# Patient Record
Sex: Male | Born: 1970 | Race: White | Hispanic: Yes | Marital: Married | Smoking: Never smoker
Health system: Southern US, Community
[De-identification: ages and names within clinical notes are randomized; demographics above are authoritative.]

## PROBLEM LIST (undated history)

## (undated) DIAGNOSIS — IMO0001 Reserved for inherently not codable concepts without codable children: Secondary | ICD-10-CM

## (undated) DIAGNOSIS — R030 Elevated blood-pressure reading, without diagnosis of hypertension: Secondary | ICD-10-CM

## (undated) HISTORY — DX: Elevated blood-pressure reading, without diagnosis of hypertension: R03.0

## (undated) HISTORY — DX: Reserved for inherently not codable concepts without codable children: IMO0001

---

## 2011-06-06 ENCOUNTER — Encounter: Payer: Self-pay | Admitting: Family Medicine

## 2011-06-06 ENCOUNTER — Ambulatory Visit (INDEPENDENT_AMBULATORY_CARE_PROVIDER_SITE_OTHER): Payer: Managed Care, Other (non HMO) | Admitting: Family Medicine

## 2011-06-06 VITALS — BP 138/98 | HR 72 | Temp 98.3°F | Ht 68.5 in | Wt 170.4 lb

## 2011-06-06 DIAGNOSIS — R109 Unspecified abdominal pain: Secondary | ICD-10-CM

## 2011-06-06 DIAGNOSIS — R1013 Epigastric pain: Secondary | ICD-10-CM | POA: Insufficient documentation

## 2011-06-06 DIAGNOSIS — IMO0001 Reserved for inherently not codable concepts without codable children: Secondary | ICD-10-CM | POA: Insufficient documentation

## 2011-06-06 DIAGNOSIS — R030 Elevated blood-pressure reading, without diagnosis of hypertension: Secondary | ICD-10-CM

## 2011-06-06 MED ORDER — OMEPRAZOLE 40 MG PO CPDR: 40.0000 mg | DELAYED_RELEASE_CAPSULE | Freq: Every day | ORAL | Status: DC

## 2011-06-06 NOTE — Assessment & Plan Note (Signed)
Has been told elevated in past.  Today diastolic elevated, first time here.  Re eval next visit. Poor diet, discussed more fruits/vegetables.

## 2011-06-06 NOTE — Assessment & Plan Note (Signed)
Anticipate GERD vs gastritis. Treat for both with 1 mo course of PPI - provided with prilosec 20mg  samples, then sent in 40mg  dose to pharmacy. Check baseline labs as well as H pylori. Return in 1 mo for f/u, sooner if worsening or not improving as expected. No red flags today

## 2011-06-06 NOTE — Progress Notes (Signed)
Subjective:    Patient ID: Christian Weiss, male    DOB: 11-03-1970, 40 y.o.   MRN: [id number]  HPI CC: new pt establish  Spanish speaker.  Stomach issues - since 09/2010 having stomach issues.  abd swelling.  Went to [facility], given med and improved but then came back.  Doesn't know what he was treated with or what diagnosis was.  Has been having epigastric abd pain for months now as well as abd distension.  No h/o acid reflux.  No throat clearing.  + gassy/bloated.  + diarrhea, black stools when takes peptobismol.  + nausea intermittently.  sxs worse with spicy foods, tomatoes.  No fevers/chills, vomiting, constipation, weight loss.  Has actually gained weight over last few months.    Poor diet.  Works at night.  Lives with daughters.  Wife left him.  Takes advil rarely.  Medications and allergies reviewed and updated in chart.  Past histories reviewed and updated if relevant as below. There is no problem list on file for this patient.  Past Medical History  Diagnosis Date  . Elevated BP    No past surgical history on file. History  Substance Use Topics  . Smoking status: Never Smoker   . Smokeless tobacco: Never Used  . Alcohol Use: No   Family History  Problem Relation Age of Onset  . Cancer Father 68    brain  . Diabetes Neg Hx   . Hypertension Neg Hx   . Coronary artery disease Neg Hx   . Stroke Neg Hx    No Known Allergies No current outpatient prescriptions on file prior to visit.   Review of Systems  Constitutional: Negative for fever, chills, activity change, appetite change, fatigue and unexpected weight change.  HENT: Negative for hearing loss and neck pain.   Eyes: Negative for visual disturbance.  Respiratory: Negative for cough, chest tightness, shortness of breath and wheezing.   Cardiovascular: Negative for chest pain, palpitations and leg swelling.  Gastrointestinal: Positive for nausea, diarrhea, blood in stool (when wiping) and abdominal distention.  Negative for vomiting, abdominal pain and constipation.  Genitourinary: Negative for hematuria and difficulty urinating.  Musculoskeletal: Negative for myalgias and arthralgias.  Skin: Negative for rash.  Neurological: Positive for headaches. Negative for dizziness, seizures and syncope.  Hematological: Does not bruise/bleed easily.  Psychiatric/Behavioral: Negative for dysphoric mood. The patient is not nervous/anxious.       Objective:   Physical Exam  Nursing note and vitals reviewed. Constitutional: He is oriented to person, place, and time. He appears well-developed and well-nourished. No distress.  HENT:  Head: Normocephalic and atraumatic.  Right Ear: Hearing, tympanic membrane, external ear and ear canal normal.  Left Ear: Hearing, tympanic membrane, external ear and ear canal normal.  Nose: Nose normal. No mucosal edema or rhinorrhea.  Mouth/Throat: Uvula is midline, oropharynx is clear and moist and mucous membranes are normal. No oropharyngeal exudate, posterior oropharyngeal edema, posterior oropharyngeal erythema or tonsillar abscesses.  Eyes: Conjunctivae and EOM are normal. Pupils are equal, round, and reactive to light. No scleral icterus.  Neck: Normal range of motion. Neck supple. No thyromegaly present.  Cardiovascular: Normal rate, regular rhythm, normal heart sounds and intact distal pulses.   No murmur heard. Pulses:      Radial pulses are 2+ on the right side, and 2+ on the left side.  Pulmonary/Chest: Effort normal and breath sounds normal. No respiratory distress. He has no wheezes. He has no rales.  Abdominal: Soft. Bowel sounds  are normal. He exhibits no distension, no abdominal bruit and no mass. There is no hepatosplenomegaly. There is tenderness (mild) in the epigastric area. There is no rebound, no guarding and no CVA tenderness. Hernia confirmed negative in the ventral area.  Musculoskeletal: Normal range of motion.  Lymphadenopathy:    He has no cervical  adenopathy.  Neurological: He is alert and oriented to person, place, and time.       CN grossly intact, station and gait intact  Skin: Skin is warm and dry. No rash noted.  Psychiatric: He has a normal mood and affect. His behavior is normal. Judgment and thought content normal.      Assessment & Plan:

## 2011-06-06 NOTE — Patient Instructions (Signed)
suena como si tiene gastritis - tome muestra de medicina prilosec 20mg  2 veces diarias (recetada 40 mg entonces una vez).  Tome medicina por 4 semanas y regrese. sangre hoy. regrese en 1 mes. Gusto conocerlo hoy.  llamenos con preguntas.  Para gastritis:  Head of bed elevated. Avoidance of citrus, fatty foods, chocolate, peppermint, and excessive alcohol, along with sodas, orange juice (acidic drinks) At least a few hours between dinner and bed, minimize naps after eating. No smoking.

## 2011-06-07 LAB — COMPREHENSIVE METABOLIC PANEL
ALT: 28 U/L (ref 0–53)
Alkaline Phosphatase: 72 U/L (ref 39–117)
Creatinine, Ser: 0.6 mg/dL (ref 0.4–1.5)
GFR: 159.98 mL/min (ref >60.00)
Sodium: 141 mEq/L (ref 135–145)
Total Bilirubin: 0.8 mg/dL (ref 0.3–1.2)
Total Protein: 6.9 g/dL (ref 6.0–8.3)

## 2011-06-07 LAB — CBC WITH DIFFERENTIAL/PLATELET
Basophils Absolute: 0 10*3/uL (ref 0.0–0.1)
HCT: 41.9 % (ref 39.0–52.0)
Hemoglobin: 14.4 g/dL (ref 13.0–17.0)
Lymphs Abs: 1.6 10*3/uL (ref 0.7–4.0)
MCHC: 34.3 g/dL (ref 30.0–36.0)
MCV: 87.7 fl (ref 78.0–100.0)
Monocytes Absolute: 0.5 10*3/uL (ref 0.1–1.0)
Neutro Abs: 2.5 10*3/uL (ref 1.4–7.7)
Platelets: 180 10*3/uL (ref 150.0–400.0)
RDW: 12.4 % (ref 11.5–14.6)

## 2011-06-07 LAB — TSH: TSH: 1.95 u[IU]/mL (ref 0.35–5.50)

## 2011-06-08 LAB — H. PYLORI ANTIBODY, IGG: H Pylori IgG: NEGATIVE

## 2011-07-06 ENCOUNTER — Ambulatory Visit (INDEPENDENT_AMBULATORY_CARE_PROVIDER_SITE_OTHER): Payer: Managed Care, Other (non HMO) | Admitting: Family Medicine

## 2011-07-06 ENCOUNTER — Encounter: Payer: Self-pay | Admitting: Family Medicine

## 2011-07-06 VITALS — BP 146/92 | HR 72 | Temp 98.2°F | Wt 169.8 lb

## 2011-07-06 DIAGNOSIS — Z23 Encounter for immunization: Secondary | ICD-10-CM

## 2011-07-06 DIAGNOSIS — R030 Elevated blood-pressure reading, without diagnosis of hypertension: Secondary | ICD-10-CM

## 2011-07-06 DIAGNOSIS — R1013 Epigastric pain: Secondary | ICD-10-CM

## 2011-07-06 DIAGNOSIS — K3189 Other diseases of stomach and duodenum: Secondary | ICD-10-CM

## 2011-07-06 MED ORDER — OMEPRAZOLE 40 MG PO CPDR: 40.0000 mg | DELAYED_RELEASE_CAPSULE | Freq: Every day | ORAL | Status: DC

## 2011-07-06 NOTE — Assessment & Plan Note (Signed)
Significant improvement on PPI.  Anticipate component of gastritis from spicy foods. Continue PRN use of omeprazole, sent in to pharmacy. RTC 3 mo for f/u. Return sooner if needed.

## 2011-07-06 NOTE — Patient Instructions (Signed)
Flu shot today. creo que tiene un poco de gastritis - use omeprazole 40mg  diarios cuando necesite. Trate de bajar comida picante. No debe tomar ibuprofeno, advil, motrina.  Tylenol (acetaminophen) esta bien. Para presion - mas frutas y verduras, menos sal/sodio. regrese en 3 meses.  Gastritis (Gastritis) La gastritis es una inflamacin (reaccin del organismo a una lesin o infeccin) del Teaching laboratory technician. A menudo la causan infecciones virales o bacterianas (grmenes). Otras causas pueden ser ciertas sustancias qumicas (incluyendo el alcohol) y los medicamentos. Esta enfermedad puede estar asociada a un malestar generalizado (sentirse cansado y mal), calambres y [place]. La enfermedad puede durar [employer] y [place]. Si los sntomas de gastritis continan, podr ser necesario someterse a una gastroscopa (observacin del estmago con un instrumento similar a un telescopio), a Physiological scientist biopsia (extraccin de Lauris Poag de tejido), y/o a pruebas de [place]. Los antibiticos no sern de utilidad a menos que la causa sea una infeccin bacteriana. Cuando la causa es [place], puede deberse a un organismo conocido como H. pylori. En este caso puede tratarse con antibiticos. Otras formas de gastritis se deben a una produccin excesiva de cido en el estmago. Esto puede tratarse con [employer] bloqueadores H2 y los anticidos. Generalmente todo lo que se necesita es un Medical laboratory scientific officer. Los nios pequeos podrn deshidratarse (perder los lquidos del organismo) con facilidad si los vmitos continan junto con la diarrea. Pueden administrarse algunos medicamentos que controlarn las nuseas. En general no se prescriben medicamentos para la diarrea, a menos que sea particularmente molesta. Algunos medicamentos retrasan la eliminacin de los virus del tracto gastrointestinal. Este factor hace ms lento el proceso de curacin. INSTRUCCIONES PARA EL CUIDADO DOMICILIARIO Instrucciones para el cuidado  domiciliario para las nuseas y los vmitos:  Para los adultos: beba con frecuencia cantidades pequeas de lquido. Beba al menos 2 litros por da. Beba a sorbos con frecuencia. No beba grandes cantidades de lquido de [employer]. As podr [employer] nuseas.   Utilice los medicamentos de venta libre o de prescripcin para Chief Technology Officer, Environmental health practitioner o la [place], segn se lo indique el profesional que lo asiste.   Beba slo lquidos claros. Son los lquidos transparentes como el agua, [address], u bebidas ligeras.   Una vez que usted [employer] lquidos claros, puede tomar cualquier lquido, sopas, jugos y helados o sorbetes. Agregue lentamente alimentos blandos (sin condimentar) a la dieta.  Instrucciones para el cuidado domiciliario en los [place] de diarrea:  La diarrea puede estar causada por una infeccin bacteriana o un virus. Su problema debe mejorar con el transcurso del [place], reposo, lquidos y/o los medicamentos antidiarreicos.   Hasta que la diarrea est bajo control, usted deber beber lquidos claros en pequeas cantidades, con frecuencia. Los lquidos claros incluyen: agua, caldo, gelatina, y t liviano.  Evite:  Leche.   Nils Pyle.   Tabaco.   Alcohol.   Lquidos muy calientes o fros.   Comer demasiado a Licensed conveyancer.  Cuando la diarrea se Chief Strategy Officer, puede agregar los siguientes alimentos que ayudan a formar las heces:  Surveyor, minerals.   Pltanos.   Manzanas sin cscara.   Tostadas secas.  Una vez que estos alimentos son [place], puede agregar yogur con bajo contenido de grasa y requesn con bajo contenido de [place]. Estos alimentos harn que recupere el equilibrio de las bacterias intestinales. Lave bien sus manos para evitar que la bacteria o el virus se diseminen. SOLICITE ATENCIN MDICA DE INMEDIATO SI:  No puede retener lquidos.  Los vmitos o la diarrea se Therapist, music (se repiten una y Laverda Page).   Aparece dolor abdominal, o ste aumenta o se localiza. Un dolor ubicado  hacia el lado derecho del abdomen puede deberse a apendicitis. Un dolor en un adulto ubicado en el lado izquierdo del abdomen puede sugerir una diverticulitis.   Aparece fiebre alta, entendida como una temperatura de ms de 102 F (38.9 C), o la temperatura que le haya indicado el profesional que lo asiste, durante ms de [address].   La diarrea se hace excesiva o contiene sangre o mucosidad.   Presenta debilidad excesiva, mareos, lipotimia o sed extrema.  Document Released: 04/20/2005 Document Revised: 03/23/2011 [facility] Patient Information 2012 [place], [place].

## 2011-07-06 NOTE — Progress Notes (Signed)
  Subjective:    Patient ID: Christian Weiss, male    DOB: 1971-03-06, 40 y.o.   MRN: [id number]  HPI CC: 1 mo f/u  Seen last month with dx dyspepsia, H pylori and other blood work negative.  Treated with 1 mo course of omeprazole.  significantly improved sxs on omeprazole.  Notes sometimes awakening in middle of sleep with hunger.  Third shift worker, sleeps during day.  Omeprazole caused some constipation.  Feels some indigestion, gassy continued.  Also noted elevated BP, here for recheck of that today.  Regular salt diet.  Does drink water.  No fruit, poor vegetable intake.   Wt Readings from Last 3 Encounters:  07/06/11 169 lb 12 oz (76.998 kg)  06/06/11 170 lb 6.4 oz (77.293 kg)   Flu shot - would like today. Tetanus - would like to defer.  Review of Systems Per HPI    Objective:   Physical Exam  Nursing note and vitals reviewed. Constitutional: He appears well-developed and well-nourished. No distress.  Abdominal: Soft. He exhibits no distension and no mass. Bowel sounds are increased. There is no hepatosplenomegaly. There is tenderness (mild) in the epigastric area. There is no rebound and no guarding.  Skin: Skin is warm and dry. No rash noted. No erythema.  Psychiatric: He has a normal mood and affect.       Assessment & Plan:

## 2011-07-06 NOTE — Assessment & Plan Note (Signed)
Remaining elevated. Poor diet. rec low salt, high potassium diet. rtc 3 mo for recheck, if staying elevated, start meds. Last visit blood work normal. May need EKG if med started.

## 2012-02-01 ENCOUNTER — Other Ambulatory Visit: Payer: Self-pay | Admitting: Internal Medicine

## 2012-02-01 LAB — TROPONIN I: Troponin-I: 0.03 ng/mL

## 2013-10-16 ENCOUNTER — Ambulatory Visit (INDEPENDENT_AMBULATORY_CARE_PROVIDER_SITE_OTHER): Payer: Managed Care, Other (non HMO) | Admitting: Family Medicine

## 2013-10-16 ENCOUNTER — Encounter: Payer: Self-pay | Admitting: Family Medicine

## 2013-10-16 VITALS — BP 142/94 | HR 66 | Temp 98.1°F | Wt 170.5 lb

## 2013-10-16 DIAGNOSIS — H9209 Otalgia, unspecified ear: Secondary | ICD-10-CM | POA: Insufficient documentation

## 2013-10-16 DIAGNOSIS — R300 Dysuria: Secondary | ICD-10-CM

## 2013-10-16 LAB — POCT URINALYSIS DIPSTICK
Blood, UA: NEGATIVE
Glucose, UA: NEGATIVE
KETONES UA: NEGATIVE
LEUKOCYTES UA: NEGATIVE
Nitrite, UA: NEGATIVE
PH UA: 6.5
Urobilinogen, UA: NEGATIVE

## 2013-10-16 MED ORDER — HYDROCORTISONE-ACETIC ACID 1-2 % OT SOLN: 5.0000 [drp] | Freq: Two times a day (BID) | OTIC | Status: DC

## 2013-10-16 MED ORDER — CIPROFLOXACIN HCL 500 MG PO TABS: 500.0000 mg | ORAL_TABLET | Freq: Two times a day (BID) | ORAL | Status: DC

## 2013-10-16 NOTE — Assessment & Plan Note (Signed)
Possible epididymitis - treat with cipro course. UA WNL today. UCx and ct/gc probes sent today. rec condoms during treatment. Update if not improving as expected.

## 2013-10-16 NOTE — Assessment & Plan Note (Signed)
Ear exam benign except for mild erythema of posterior canal.  Pt does wear headphones at work - I wonder if poor fit leading to ear pain.  Will treat possible mild otitis externa with vosol HC drops and pt will update me if sxs persist or fail to improve.  TM clear without significant fluid today.

## 2013-10-16 NOTE — Progress Notes (Signed)
Pre visit review using our clinic review tool, if applicable. No additional management support is needed unless otherwise documented below in the visit note. 

## 2013-10-16 NOTE — Progress Notes (Signed)
BP 142/94  Pulse 66  Temp(Src) 98.1 F (36.7 C) (Oral)  Wt 170 lb 8 oz (77.338 kg)  SpO2 99%   CC: ear pain, abd pain  Subjective:    Patient ID: Christian Weiss Christian Weiss, male    DOB: 07-09-1971, 43 y.o.   MRN: [id number]  HPI: Christian Weiss Christian Weiss is a 43 y.o. male presenting on 10/16/2013 for Otalgia and Left abdominal pain   States BP at home 120-140 systolic.  Bilateral ear pain ongoing for last 3-4 months.  Some muffling and popping in ears - decreased hearing.  Did have cold 3 weeks ago.  Denies fevers/chills, current congestion, PNdrainage, headache. Wears headphones at work.  Has tried cleaning ears with candle  Some LLQ abd pain ongoing for last 3 months as well.  Worse when he's hungry.  No diarrhea, constipation, nausea/vomiting, blood in stool.    Some penile discharge with masturbation and noticed dark ejaculate - since then noticing some dysuria and urgency.  No frequency or hematuria.  No fevers/chills, some ongoing flank pain for years - attributed to heavy lifting at work - improved with stretching.  Currently sexually active in last 6 mo with only 1 steady partner.  Some anal sex.  Partner on OCP, pt doesn't use condoms.  Some intermittent R testicular pain.  Relevant past medical, surgical, family and social history reviewed and updated as indicated.  Allergies and medications reviewed and updated. Current Outpatient Prescriptions on File Prior to Visit  Medication Sig  . omeprazole (PRILOSEC) 40 MG capsule Take 1 capsule (40 mg total) by mouth daily.   No current facility-administered medications on file prior to visit.    Review of Systems Per HPI unless specifically indicated above    Objective:    BP 142/94  Pulse 66  Temp(Src) 98.1 F (36.7 C) (Oral)  Wt 170 lb 8 oz (77.338 kg)  SpO2 99%  Physical Exam  Nursing note and vitals reviewed. Constitutional: He appears well-developed and well-nourished. No distress.  HENT:  Head: Normocephalic and atraumatic.   Right Ear: Hearing, tympanic membrane, external ear and ear canal normal.  Left Ear: Hearing, tympanic membrane, external ear and ear canal normal.  Nose: Nose normal. No mucosal edema or rhinorrhea. Right sinus exhibits no maxillary sinus tenderness and no frontal sinus tenderness. Left sinus exhibits no maxillary sinus tenderness and no frontal sinus tenderness.  Mouth/Throat: Uvula is midline, oropharynx is clear and moist and mucous membranes are normal. No oropharyngeal exudate, posterior oropharyngeal edema, posterior oropharyngeal erythema or tonsillar abscesses.  Slight erythema posterior R ear canal otherwise normal exam  Eyes: Conjunctivae and EOM are normal. Pupils are equal, round, and reactive to light. No scleral icterus.  Neck: Normal range of motion. Neck supple.  Cardiovascular: Normal rate, regular rhythm, normal heart sounds and intact distal pulses.   No murmur heard. Pulmonary/Chest: Effort normal and breath sounds normal. No respiratory distress. He has no wheezes. He has no rales.  Abdominal: Soft. Normal appearance and bowel sounds are normal. He exhibits no distension and no mass. There is tenderness (mild) in the right upper quadrant and left lower quadrant. There is no rigidity, no rebound, no guarding, no CVA tenderness and negative Murphy's sign. Hernia confirmed negative in the right inguinal area and confirmed negative in the left inguinal area.  Genitourinary: Testes normal and penis normal. Right testis shows no mass, no swelling and no tenderness. Right testis is descended. Left testis shows no mass, no swelling and no tenderness. Left  testis is descended. Uncircumcised. No discharge found.  nontender genital exam but small cyst appreciated superior R epididymis  Lymphadenopathy:    He has no cervical adenopathy.       Right: No inguinal adenopathy present.       Left: No inguinal adenopathy present.  Skin: Skin is warm and dry. No rash noted.       Assessment  & Plan:   Problem List Items Addressed This Visit   Dysuria     Possible epididymitis - treat with cipro course. UA WNL today. UCx and ct/gc probes sent today. rec condoms during treatment. Update if not improving as expected.    Relevant Orders      GC/chlamydia probe amp, genital      Urinalysis Dipstick (Completed)      Urine culture   Ear pain - Primary     Ear exam benign except for mild erythema of posterior canal.  Pt does wear headphones at work - I wonder if poor fit leading to ear pain.  Will treat possible mild otitis externa with vosol HC drops and pt will update me if sxs persist or fail to improve.  TM clear without significant fluid today.        Follow up plan: Return if symptoms worsen or fail to improve.

## 2013-10-16 NOTE — Patient Instructions (Signed)
Revise presion en trabajo - y si consistentemente presion >140/90 dejeme saber. Para oidos - puede usar gotitas para posible infeccion. revisamos [place] hoy - y lo llamaremos con resultados. Para posible infeccion empieze cipro 500mg  dos veces al dia por 1 semana.  si no mejora dejeme saber. Hearing screen today Urine checked today.  Epididimitis (Epididymitis) La epididimitis es una inflamacin (reaccin del organismo a una lesin o infeccin) del epiddimo. El epiddimo es [place] estructura similar una cuerda ubicada en la parte posterior de los testculos. Generalmente la causa es una infeccin, aunque no siempre. Generalmente se trata de un trastorno sbito, que comienza con escalofros, fiebre y Engineer, miningdolor detrs del escroto y en el testculo. Puede haber hinchazn y enrojecimiento de los testculos. DIAGNSTICO El examen fsico puede revelar un epiddimo sensible e hinchado. Los cultivos de [place] y de las secreciones prostticas ayudarn a Production assistant, radiodeterminar la causa de la infeccin. Algunas veces se practica un anlisis de sangre para observar si el recuento de glbulos blancos es elevado y si se trata de una infeccin bacteriana (grmenes) o viral. Con estos datos, el profesional que lo asiste podr Psychologist, counsellingrecetarle un antibitico (medicamentos que destruyen los grmenes) adecuado para la infeccin bacteriana. La infeccin viral que ocasiona la epididimitis generalmente se resolver sin tratamiento. INSTRUCCIONES PARA EL CUIDADO DOMICILIARIO  Para aliviar el dolor, tome baos de asiento calientes durante 20 minutos, cuatro veces por Futures traderda.  Utilice los medicamentos de venta libre o de prescripcin para Chief Technology Officerel dolor, Environmental health practitionerel malestar o la [place], segn se lo indique el profesional que lo asiste.  Tome la medicacin, incluidos los antibiticos, como se le indic. Tome los antibiticos durante todo el tiempo que le han indicado, aun si se siente mejor.  Es muy importante concurrir a todas las citas para Designer, multimediael  seguimiento. SOLICITE ATENCIN MDICA DE INMEDIATO SI:  Tiene fiebre.  El dolor no se alivia con los United Parcelmedicamentos.  Los sntomas (problemas) que originalmente lo trajeron a Stage managerla consulta empeoran.  El dolor puede aparecer y Geneticist, moleculardesaparecer.  Comienza a Financial risk analystsentir dolor, observa enrojecimiento e hinchazn en el escroto y en las zonas que lo rodean. EST SEGURO QUE:  Comprende las instrucciones para el alta mdica.  Controlar su enfermedad.  Solicitar atencin mdica de inmediato segn las indicaciones. Document Released: 07/11/2005 Document Revised: 10/03/2011 [facility] Patient Information 2014 [place], [place].

## 2013-10-17 LAB — URINE CULTURE
COLONY COUNT: NO GROWTH
Organism ID, Bacteria: NO GROWTH

## 2013-10-17 LAB — GC/CHLAMYDIA PROBE AMP
CT PROBE, AMP APTIMA: NEGATIVE
GC PROBE AMP APTIMA: NEGATIVE

## 2015-07-09 ENCOUNTER — Emergency Department
Admission: EM | Admit: 2015-07-09 | Discharge: 2015-07-09 | Disposition: A | Discharge status: Home / Self Care | Payer: Managed Care, Other (non HMO) | Attending: Emergency Medicine | Admitting: Emergency Medicine

## 2015-07-09 ENCOUNTER — Emergency Department: Payer: Managed Care, Other (non HMO)

## 2015-07-09 ENCOUNTER — Encounter: Payer: Self-pay | Admitting: Emergency Medicine

## 2015-07-09 DIAGNOSIS — Z79899 Other long term (current) drug therapy: Secondary | ICD-10-CM | POA: Insufficient documentation

## 2015-07-09 DIAGNOSIS — Z792 Long term (current) use of antibiotics: Secondary | ICD-10-CM | POA: Diagnosis not present

## 2015-07-09 DIAGNOSIS — R519 Headache, unspecified: Secondary | ICD-10-CM

## 2015-07-09 DIAGNOSIS — R51 Headache: Secondary | ICD-10-CM | POA: Insufficient documentation

## 2015-07-09 MED ORDER — METOCLOPRAMIDE HCL 5 MG/ML IJ SOLN
20.0000 mg | Freq: Once | INTRAMUSCULAR | Status: AC
  Administered 2015-07-09: 20 mg via INTRAVENOUS
  Filled 2015-07-09: qty 4

## 2015-07-09 MED ORDER — DIPHENHYDRAMINE HCL 50 MG/ML IJ SOLN
25.0000 mg | Freq: Once | INTRAMUSCULAR | Status: AC
  Administered 2015-07-09: 25 mg via INTRAVENOUS
  Filled 2015-07-09: qty 1

## 2015-07-09 MED ORDER — KETOROLAC TROMETHAMINE 30 MG/ML IJ SOLN
30.0000 mg | Freq: Once | INTRAMUSCULAR | Status: AC
  Administered 2015-07-09: 30 mg via INTRAVENOUS
  Filled 2015-07-09: qty 1

## 2015-07-09 NOTE — ED Notes (Signed)
A/o, perrl, gcs 15. Equal strong grips. No arm or leg weakness. Pain 8/10 anterior head.

## 2015-07-09 NOTE — ED Notes (Signed)
Pt presents with headache for one week and cannot sleep. [facility]. Pt denies any other sx at present time.

## 2015-07-09 NOTE — ED Provider Notes (Signed)
[facility] Emergency Department Provider Note  ____________________________________________  Time seen: On arrival  I have reviewed the triage vital signs and the nursing notes.   HISTORY  Chief Complaint Headache  Electronic interpreter service used  HPI Christian Weiss is a 44 y.o. male who presents with complaints of headache for approximately 1 week which is keeping him from being able to sleep. He complains of frontal 8 out of 10 and pounding anterior headache. No neuro deficits. No fevers or chills. No neck pain. No sick contacts. He was born in [place] but reports vaccinations are up-to-date. No nausea or vomiting.     Past Medical History  Diagnosis Date  . Elevated BP     Patient Active Problem List   Diagnosis Date Noted  . Dysuria 10/16/2013  . Ear pain 10/16/2013  . Dyspepsia 06/06/2011  . Elevated BP     History reviewed. No pertinent past surgical history.  Current Outpatient Rx  Name  Route  Sig  Dispense  Refill  . acetic acid-hydrocortisone (VOSOL-HC) otic solution   Right Ear   Place 5 drops into the right ear 2 (two) times daily. For 5 days   10 mL   0   . ciprofloxacin (CIPRO) 500 MG tablet   Oral   Take 1 tablet (500 mg total) by mouth 2 (two) times daily.   14 tablet   0   . EXPIRED: omeprazole (PRILOSEC) 40 MG capsule   Oral   Take 1 capsule (40 mg total) by mouth daily.   30 capsule   3     Allergies Review of patient's allergies indicates no known allergies.  Family History  Problem Relation Age of Onset  . Cancer Father 60    brain  . Diabetes Neg Hx   . Hypertension Neg Hx   . Coronary artery disease Neg Hx   . Stroke Neg Hx     Social History Social History  Substance Use Topics  . Smoking status: Never Smoker   . Smokeless tobacco: Never Used  . Alcohol Use: No    Review of Systems  Constitutional: Negative for fever. Eyes: Negative for visual changes. ENT: Negative for sore  throat Cardiovascular: Negative for chest pain. Respiratory: Negative for shortness of breath. Gastrointestinal: No nausea or vomiting Genitourinary: Negative for dysuria. Musculoskeletal: Negative for back pain. Negative for neck pain Skin: Negative for rash. Neurological: Negative for focal weakness Psychiatric: No anxiety    ____________________________________________   PHYSICAL EXAM:  VITAL SIGNS: ED Triage Vitals  Enc Vitals Group     BP 07/09/15 1414 153/103 mmHg     Pulse Rate 07/09/15 1414 91     Resp 07/09/15 1414 18     Temp 07/09/15 1414 97.9 F (36.6 C)     Temp Source 07/09/15 1414 Oral     SpO2 07/09/15 1414 100 %     Weight 07/09/15 1414 162 lb (73.483 kg)     Height 07/09/15 1414  (1.702 m)     Head Cir --      Peak Flow --      Pain Score 07/09/15 1421 8     Pain Loc --      Pain Edu? --      Excl. in GC? --      Constitutional: Alert and oriented. Well appearing and in no distress. Eyes: Conjunctivae are normal. PERRLA, EOMI ENT   Head: Normocephalic and atraumatic.   Mouth/Throat: Mucous membranes are moist.  Cardiovascular: Normal rate, regular rhythm. Normal and symmetric distal pulses are present in all extremities. Respiratory: Normal respiratory effort without tachypnea nor retractions.  Gastrointestinal: Soft and non-tender in all quadrants. No distention. There is no CVA tenderness. Genitourinary: deferred Musculoskeletal: Nontender with normal range of motion in all extremities. No lower extremity tenderness nor edema. Neurologic:  Normal speech and language. No gross focal neurologic deficits are appreciated. Cranial nerves II through XII are normal Skin:  Skin is warm, dry and intact. No rash noted. Psychiatric: Mood and affect are normal. Patient exhibits appropriate insight and judgment.  ____________________________________________    LABS (pertinent positives/negatives)  Labs Reviewed - No data to  display  ____________________________________________   EKG  None  ____________________________________________    RADIOLOGY I have personally reviewed any xrays that were ordered on this patient: CT head unremarkable  ____________________________________________   PROCEDURES  Procedure(s) performed: none  Critical Care performed: none  ____________________________________________   INITIAL IMPRESSION / ASSESSMENT AND PLAN / ED COURSE  Pertinent labs & imaging results that were available during my care of the patient were reviewed by me and considered in my medical decision making (see chart for details).  Patient overall well-appearing and in no acute distress. Mildly hypertensive on exam. Normal neuro exam. CT head unremarkable. We will treat with Toradol, Benadryl, Reglan and reevaluate.  ----------------------------------------- 4:42 PM on 07/09/2015 -----------------------------------------  Patient's symptoms have completely resolved after treatment. He feels much better. We will discharge with outpatient follow-up as needed. Return precautions discussed.  ____________________________________________   FINAL CLINICAL IMPRESSION(S) / ED DIAGNOSES  Final diagnoses:  Acute nonintractable headache, unspecified headache type     Jene Everyobert Sahas Sluka, MD 07/09/15 1642

## 2015-07-09 NOTE — Discharge Instructions (Signed)
Cefalea migrañosa  (Migraine Headache)  Una cefalea migrañosa es un dolor intenso y punzante en uno o ambos lados de la cabeza. La migraña puede durar desde 30 minutos hasta varias horas.  CAUSAS   No siempre se conoce la causa exacta de la cefalea migrañosa. Sin embargo, pueden aparecer cuando los nervios del cerebro se irritan y liberan ciertas sustancias químicas que causan inflamación. Esto ocasiona dolor.  Existen también ciertos factores que pueden desencadenar las migrañas, como los siguientes:  · Alcohol.  · Fumar.  · Estrés.  · La menstruación  · Quesos añejados.  · Los alimentos o las bebidas que contienen nitratos, glutamato, aspartamo o tiramina.  · Falta de sueño.  · Chocolate.  · Cafeína.  · Hambre.  · Actividad física extenuante.  · Fatiga.  · Medicamentos que se usan para tratar el dolor en el pecho (nitroglicerina), píldoras anticonceptivas, estrógeno y algunos medicamentos para la hipertensión arterial.  SIGNOS Y SÍNTOMAS  · Dolor en uno o ambos lados de la cabeza.  · Dolor pulsante o punzante.  · Dolor intenso que impide realizar las actividades diarias.  · Dolor que se agrava por cualquier actividad física.  · Náuseas, vómitos o ambos.  · Mareos.  · Dolor con la exposición a las luces brillantes, a los ruidos fuertes o la actividad.  · Sensibilidad general a las luces brillantes, a los ruidos fuertes o a los olores.  Antes de sufrir una migraña, puede recibir señales de advertencia (aura). Un aura puede incluir:  · Ver las luces intermitentes.  · Ver puntos brillantes, halos o líneas en zigzag.  · Tener una visión en túnel o visión borrosa.  · Sensación de entumecimiento u hormigueo.  · Dificultad para hablar  · Debilidad muscular.  DIAGNÓSTICO   La cefalea migrañosa se diagnostica en función de lo siguiente:  · Síntomas.  · Examen físico.  · Una TC (tomografía computada) o resonancia magnética de la cabeza. Estas pruebas de diagnóstico por imagen no pueden diagnosticar las migrañas, pero pueden  ayudar a descartar otras causas de las cefaleas.  TRATAMIENTO  Le prescribirán medicamentos para aliviar el dolor y las náuseas. También podrán administrarse medicamentos para ayudar a prevenir las migrañas recurrentes.   INSTRUCCIONES PARA EL CUIDADO EN EL HOGAR  · Sólo tome medicamentos de venta libre o recetados para calmar el dolor o el malestar, según las indicaciones de su médico. No se recomienda usar los opiáceos a largo plazo.  · Cuando tenga la migraña, acuéstese en un cuarto oscuro y tranquilo  · Lleve un registro diario para averiguar lo que puede provocar las cefaleas migrañosas. Por ejemplo, escriba:    Lo que usted come y bebe.    Cuánto tiempo duerme.    Algún cambio en su dieta o en los medicamentos.  · Limite el consumo de bebidas alcohólicas.  · Si fuma, deje de hacerlo.  · Duerma entre 7 y 9 horas, o según las recomendaciones del médico.  · Limite el estrés.  · Mantenga las luces tenues si le molestan las luces brillantes y la migraña empeora.  SOLICITE ATENCIÓN MÉDICA DE INMEDIATO SI:   · La migraña se hace cada vez más intensa.  · Tiene fiebre.  · Presenta rigidez en el cuello.  · Tiene pérdida de visión.  · Presenta debilidad muscular o pérdida del control muscular.  · Comienza a perder el equilibrio o tiene problemas para caminar.  · Sufre mareos o se desmaya.  · Tiene síntomas graves que son diferentes   a los primeros síntomas.  ASEGÚRESE DE QUE:   · Comprende estas instrucciones.  · Controlará su afección.  · Recibirá ayuda de inmediato si no mejora o si empeora.     Esta información no tiene como fin reemplazar el consejo del médico. Asegúrese de hacerle al médico cualquier pregunta que tenga.     Document Released: 07/11/2005 Document Revised: 05/01/2013  Elsevier Interactive Patient Education ©2016 Elsevier Inc.

## 2017-03-18 IMAGING — CT CT HEAD W/O CM
1 series · 16 of 30 positions shown, 20 images · non-contrast
Comparison: None.

CLINICAL DATA: Headache for 1 week.  Difficulty sleeping.

EXAM:
CT HEAD WITHOUT CONTRAST
TECHNIQUE: Contiguous axial images were obtained from the base of the skull
through the vertex without intravenous contrast.

[Series 2: head wo · axial · 0.42mm/px · z∈[+519,+654]mm · 16 of 30 slices shown, 20 images]
[im 2/30  brain]
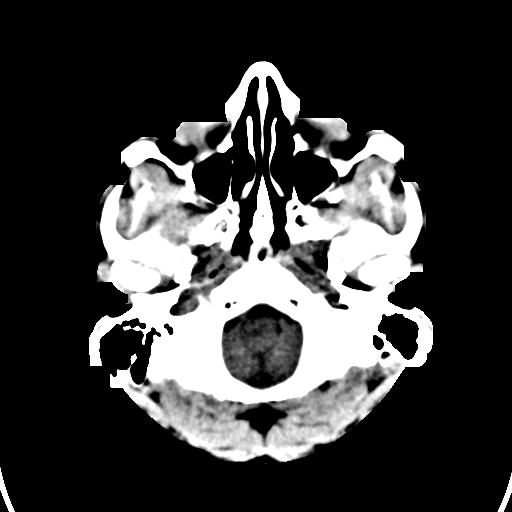
[im 2/30  bone]
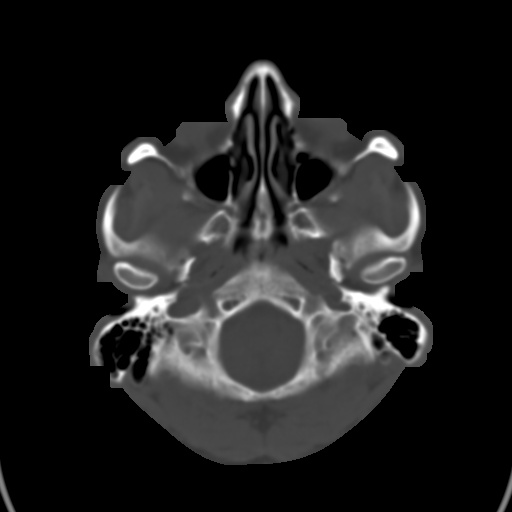
[im 4/30  brain]
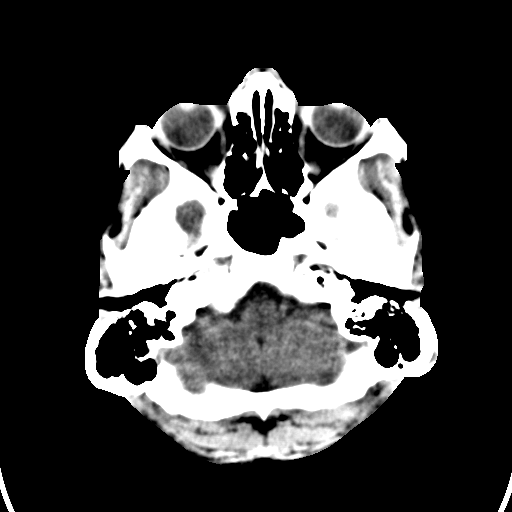
[im 6/30  brain]
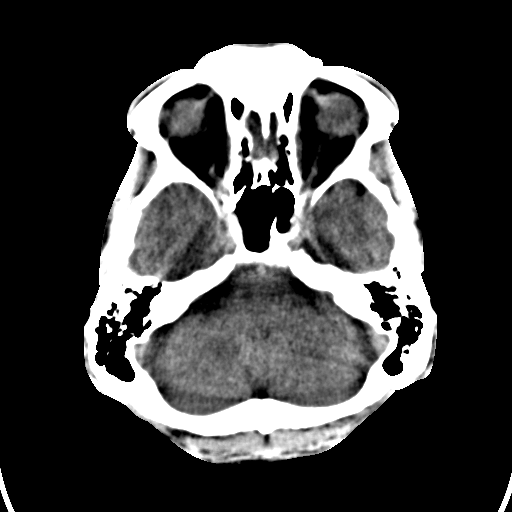
[im 8/30  brain]
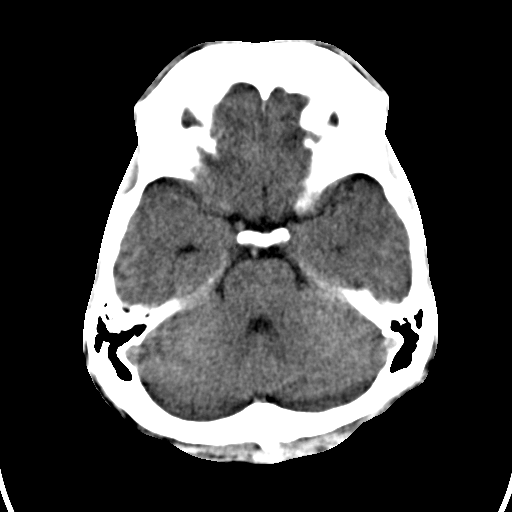
[im 9/30  brain]
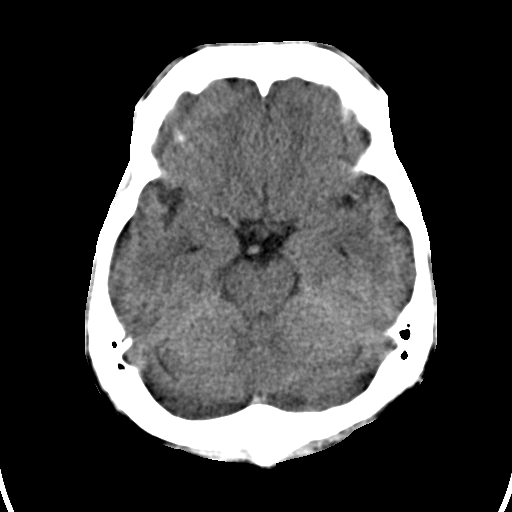
[im 9/30  bone]
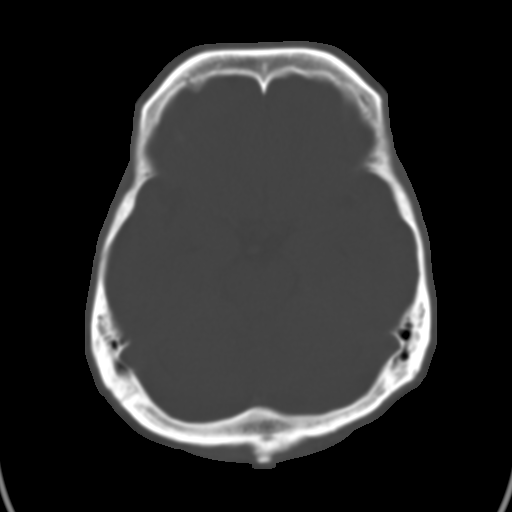
[im 11/30  brain]
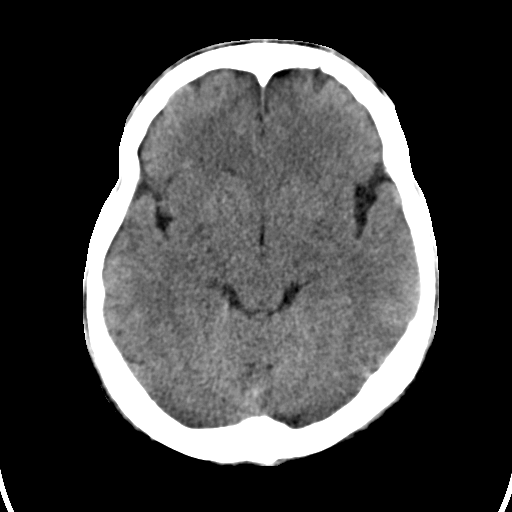
[im 13/30  brain]
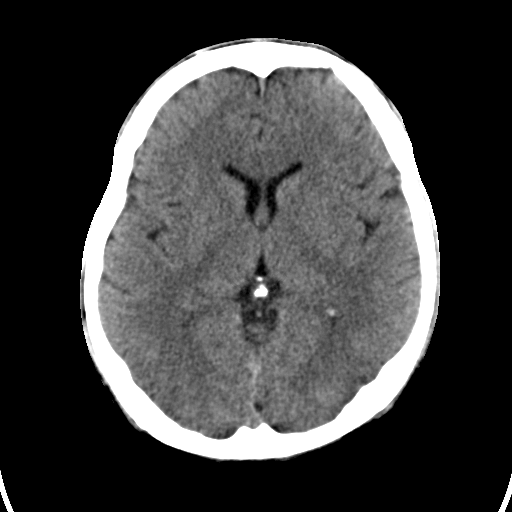
[im 15/30  brain]
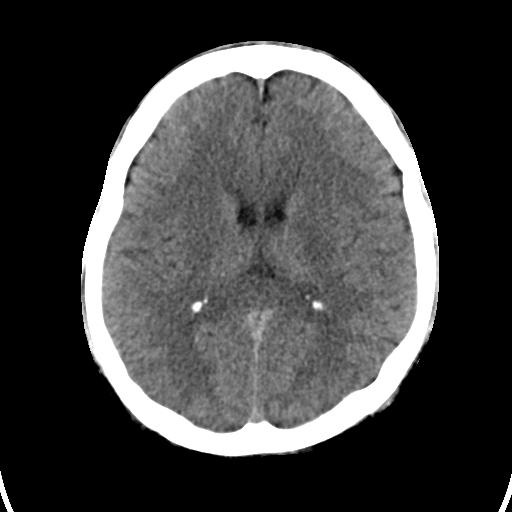
[im 16/30  brain]
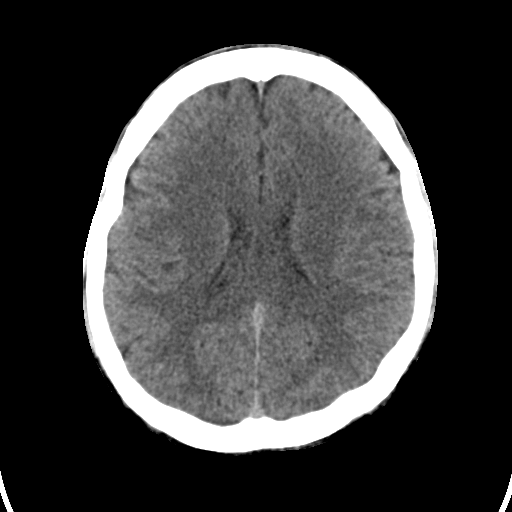
[im 16/30  bone]
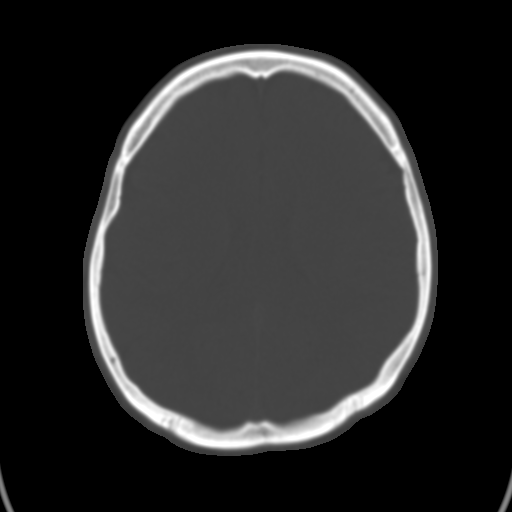
[im 18/30  brain]
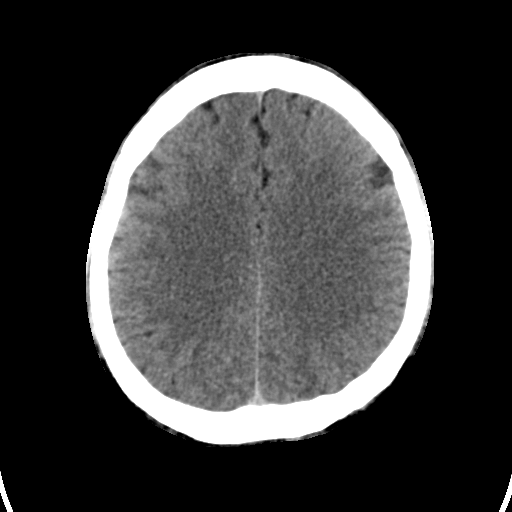
[im 20/30  brain]
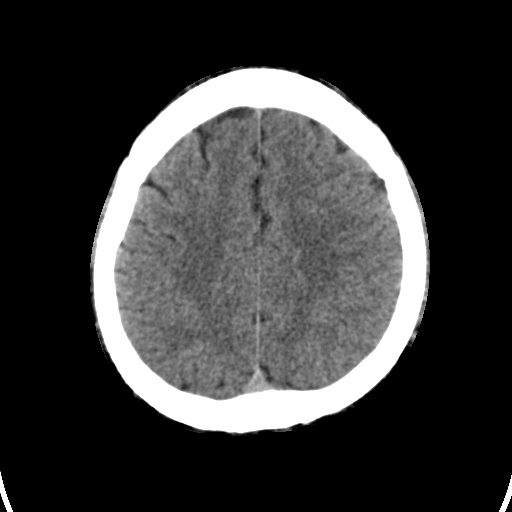
[im 22/30  brain]
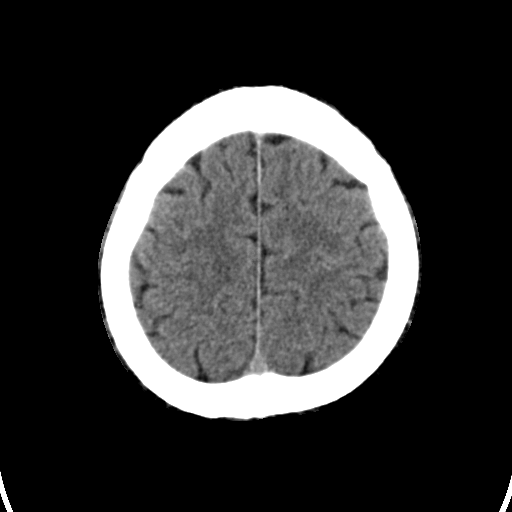
[im 23/30  brain]
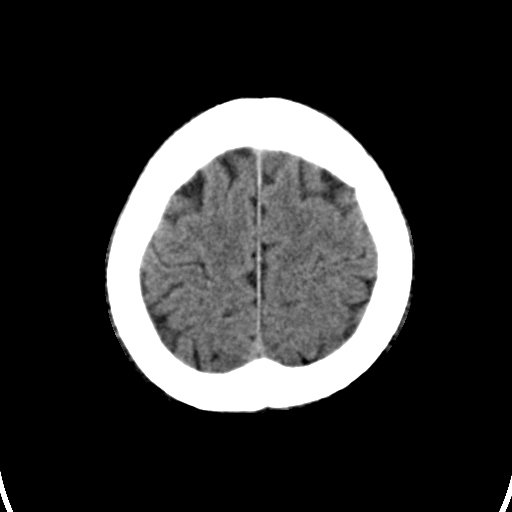
[im 23/30  bone]
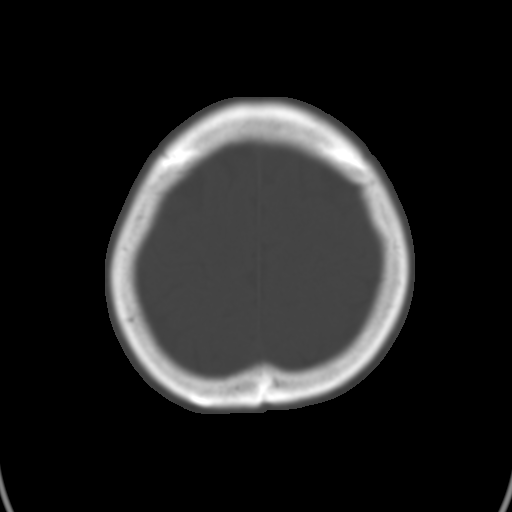
[im 25/30  brain]
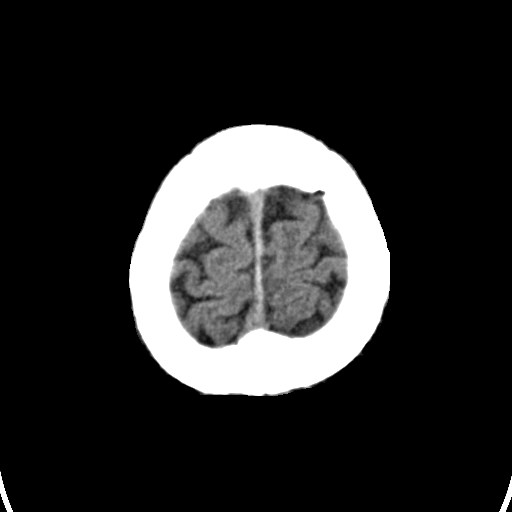
[im 27/30  brain]
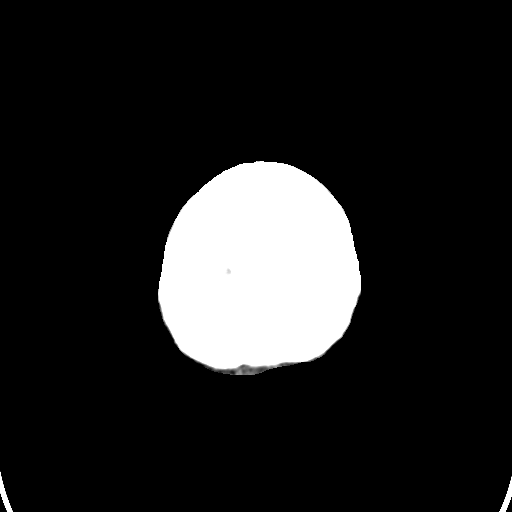
[im 29/30  brain]
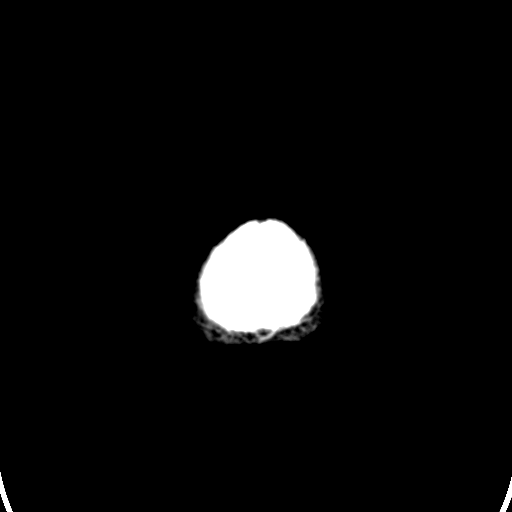

[16 of 30 positions shown; findings below may reference images not displayed]

FINDINGS: Gray-white differentiation is maintained. No CT evidence of acute
large territory infarct. No intraparenchymal or extra-axial mass or
hemorrhage. Normal size and configuration of the ventricles and
basilar cisterns. No midline shift. There is underpneumatization of
the bilateral frontal sinuses. The remaining paranasal sinuses and
mastoid air cells are normally aerated. No air-fluid levels. A
benign-appearing punctate (approximately 0.6 cm) presumed
interosseous osseous hemangiomas noted within the left frontal
calvarium (image 13, series 3) without associated periostitis. No
calvarial fracture. Regional soft tissues appear normal.
IMPRESSION: Negative noncontrast head CT.

## 2017-04-10 ENCOUNTER — Telehealth: Payer: Self-pay | Admitting: Family Medicine

## 2017-04-10 NOTE — Telephone Encounter (Signed)
Pt called to set up appt for cpe but last visit was 09/2013. Can he re-est with you?

## 2017-04-10 NOTE — Telephone Encounter (Signed)
Yes ok to re establish - thanks!

## 2017-05-09 NOTE — Telephone Encounter (Signed)
I spoke with pt after several attempts. He has gone to elsewhere for cpe but will call back to re-est.

## 2020-05-18 ENCOUNTER — Telehealth: Payer: Self-pay

## 2020-05-18 NOTE — Telephone Encounter (Signed)
Ok to schedule. Thanks

## 2020-05-18 NOTE — Telephone Encounter (Signed)
Patient called in wanting to schedule physical. Patient previously seen by Dr.G. Last appointment 10/16/13. Please advise if able to re-establish and schedule.

## 2020-05-21 ENCOUNTER — Telehealth: Payer: Self-pay | Admitting: Family Medicine

## 2020-05-21 NOTE — Telephone Encounter (Signed)
Called patient to schedule appointment. No answer

## 2020-05-21 NOTE — Telephone Encounter (Signed)
Pt called in due to he wants to see Dr.Gutierrez due to he speaks Spanish and he needs a new patent visit and he wants a physical.  Please advise.Marland Kitchen and he is okay with leaving a VM on his phone due to he is working.

## 2020-05-21 NOTE — Telephone Encounter (Signed)
See note from earlier this week. Please call pt back.

## 2020-05-21 NOTE — Telephone Encounter (Signed)
Okay thank you

## 2020-06-02 NOTE — Telephone Encounter (Signed)
Called patient to schedule. Pt already scheduled for 12/10.

## 2020-07-03 ENCOUNTER — Ambulatory Visit: Payer: Managed Care, Other (non HMO) | Admitting: Family Medicine

## 2020-07-03 ENCOUNTER — Encounter: Payer: Self-pay | Admitting: Family Medicine

## 2020-07-03 ENCOUNTER — Other Ambulatory Visit: Payer: Self-pay

## 2020-07-03 VITALS — BP 142/100 | HR 76 | Temp 97.8°F | Ht 67.5 in | Wt 177.5 lb

## 2020-07-03 DIAGNOSIS — I10 Essential (primary) hypertension: Secondary | ICD-10-CM | POA: Diagnosis not present

## 2020-07-03 DIAGNOSIS — N529 Male erectile dysfunction, unspecified: Secondary | ICD-10-CM

## 2020-07-03 DIAGNOSIS — Z1211 Encounter for screening for malignant neoplasm of colon: Secondary | ICD-10-CM

## 2020-07-03 DIAGNOSIS — Z0001 Encounter for general adult medical examination with abnormal findings: Secondary | ICD-10-CM

## 2020-07-03 DIAGNOSIS — Z23 Encounter for immunization: Secondary | ICD-10-CM

## 2020-07-03 DIAGNOSIS — R0602 Shortness of breath: Secondary | ICD-10-CM

## 2020-07-03 DIAGNOSIS — Z0000 Encounter for general adult medical examination without abnormal findings: Secondary | ICD-10-CM | POA: Insufficient documentation

## 2020-07-03 LAB — COMPREHENSIVE METABOLIC PANEL
ALT: 23 U/L (ref 0–53)
AST: 20 U/L (ref 0–37)
Albumin: 4.3 g/dL (ref 3.5–5.2)
Alkaline Phosphatase: 65 U/L (ref 39–117)
BUN: 10 mg/dL (ref 6–23)
CO2: 30 mEq/L (ref 19–32)
Calcium: 9 mg/dL (ref 8.4–10.5)
Chloride: 104 mEq/L (ref 96–112)
Creatinine, Ser: 0.7 mg/dL (ref 0.40–1.50)
GFR: 109.81 mL/min (ref >60.00)
Glucose, Bld: 90 mg/dL (ref 70–99)
Potassium: 4.2 mEq/L (ref 3.5–5.1)
Sodium: 141 mEq/L (ref 135–145)
Total Bilirubin: 0.5 mg/dL (ref 0.2–1.2)
Total Protein: 6.8 g/dL (ref 6.0–8.3)

## 2020-07-03 LAB — MICROALBUMIN / CREATININE URINE RATIO
Creatinine,U: 169.8 mg/dL
Microalb Creat Ratio: 0.6 mg/g (ref 0.0–30.0)
Microalb, Ur: 1.1 mg/dL (ref 0.0–1.9)

## 2020-07-03 LAB — LIPID PANEL
Cholesterol: 195 mg/dL (ref 0–200)
HDL: 42.1 mg/dL (ref >39.00)
NonHDL: 152.49
Total CHOL/HDL Ratio: 5
Triglycerides: 213 mg/dL — ABNORMAL HIGH (ref 0.0–149.0)
VLDL: 42.6 mg/dL — ABNORMAL HIGH (ref 0.0–40.0)

## 2020-07-03 LAB — LDL CHOLESTEROL, DIRECT: Direct LDL: 119 mg/dL

## 2020-07-03 LAB — TSH: TSH: 2.26 u[IU]/mL (ref 0.35–4.50)

## 2020-07-03 MED ORDER — SILDENAFIL CITRATE 50 MG PO TABS: 50.0000 mg | ORAL_TABLET | Freq: Every day | ORAL | 0 refills | Status: DC | PRN

## 2020-07-03 MED ORDER — LISINOPRIL 20 MG PO TABS: 20.0000 mg | ORAL_TABLET | Freq: Every day | ORAL | 3 refills | Status: DC

## 2020-07-03 NOTE — Assessment & Plan Note (Signed)
Preventative protocols reviewed and updated unless pt declined. Discussed healthy diet and lifestyle.  

## 2020-07-03 NOTE — Patient Instructions (Addendum)
Return as needed or in 1 year for next physical.  Tdap today  laboratorios hoy.  EKG today  Comienze lisinopril 20mg  de nuevo  Regresar en 1 mes para seguimiento de presion arterial.   Mantenimiento de , Male Adoptar un estilo de vida saludable y recibir atencin preventiva son importantes para promover la salud y Research officer, political party. Consulte al mdico sobre:  El esquema adecuado para hacerse pruebas y exmenes peridicos.  Cosas que puede hacer por su cuenta para prevenir enfermedades y [place] sano. Qu debo saber sobre la dieta, el peso y el ejercicio? Consuma una dieta saludable   Consuma una dieta que incluya muchas verduras, frutas, productos lcteos con bajo contenido de [place] y [place]  No consuma muchos alimentos ricos en grasas slidas, azcares agregados o sodio. Mantenga un peso saludable El ndice de masa muscular [facility]) es una medida que puede utilizarse para identificar posibles problemas de [place]. Proporciona una estimacin de la grasa corporal basndose en el peso y la altura. Su mdico puede ayudarle a [facility] y a Engineer, site o Personnel officer un peso saludable. Haga ejercicio con regularidad Haga ejercicio con regularidad. Esta es una de las prcticas ms importantes que puede hacer por su salud. La mayora de los adultos deben seguir estas pautas:  Pharmacologist, al menos, Education officer, environmental de actividad fsica por semana. El ejercicio debe aumentar la frecuencia cardaca y transpirar (ejercicio de intensidad moderada).  Hacer ejercicios de fortalecimiento por lo Media planner por semana. Agregue esto a su plan de ejercicio de intensidad moderada.  Pasar menos tiempo sentados. Incluso la actividad fsica ligera puede ser beneficiosa. Controle sus niveles de colesterol y lpidos en la sangre Comience a realizarse anlisis de lpidos y [employer] en la sangre a los 20aos y luego reptalos cada 5aos. Es posible que  Oncologist los niveles de colesterol con mayor frecuencia si:  Sus niveles de lpidos y colesterol son altos.  Es mayor de 40aos.  Presenta un alto riesgo de padecer enfermedades cardacas. Qu debo saber sobre las pruebas de deteccin del cncer? Muchos tipos de cncer pueden detectarse de manera temprana y, a menudo, pueden prevenirse. Segn su historia clnica y sus antecedentes familiares, es posible que deba realizarse pruebas de deteccin del cncer en diferentes edades. Esto puede incluir pruebas de deteccin de lo siguiente:  04-28-1981.  Cncer de prstata.  Cncer de piel.  Cncer de pulmn. Qu debo saber sobre la enfermedad cardaca, la diabetes y la hipertensin arterial? Presin arterial y enfermedad cardaca  La hipertensin arterial causa enfermedades cardacas y Building services engineer el riesgo de accidente cerebrovascular. Es ms probable que esto se manifieste en las personas que tienen lecturas de presin arterial alta, tienen ascendencia africana o tienen sobrepeso.  Hable con el mdico sobre sus valores de presin arterial deseados.  Hgase controlar la presin arterial: ? Cada 3 a 5 aos si tiene entre 18 y 58 aos. ? Todos los aos si es mayor de 24.  Si tiene entre 65 y 59 aos y es fumador o 61, pregntele al mdico si debe realizarse una prueba de deteccin de aneurisma artico abdominal (AAA) por nica vez. Diabetes Realcese exmenes de deteccin de la diabetes con regularidad. Este anlisis revisa el nivel de azcar en la sangre en [place]. Hgase las pruebas de deteccin:  Cada tresaos despus de los 45aos de edad si tiene un peso normal y un bajo riesgo de padecer diabetes.  Con ms frecuencia y a  partir de una edad inferior si tiene sobrepeso o un alto riesgo de padecer diabetes. Qu debo saber sobre la prevencin de infecciones? Hepatitis B Si tiene un riesgo ms alto de contraer hepatitis B, debe someterse a un examen de  deteccin de este virus. Hable con el mdico para averiguar si tiene riesgo de contraer la infeccin por hepatitis B. Hepatitis C Se recomienda un anlisis de [place] para:  Todos los que nacieron entre 1943 y [id number].  Todas las personas que tengan un riesgo de haber contrado hepatitis C. Enfermedades de transmisin sexual (ETS)  Debe realizarse pruebas de deteccin de ITS todos los aos, incluidas la gonorrea y la clamidia, si: ? Es sexualmente activo y es menor de 24aos. ? Es mayor de 24aos, y Public affairs consultant informa que corre riesgo de tener este tipo de infecciones. ? La actividad sexual ha cambiado desde que le hicieron la ltima prueba de deteccin y tiene un riesgo mayor de Warehouse manager clamidia o Copy. Pregntele al mdico si usted tiene riesgo.  Pregntele al mdico si usted tiene un alto riesgo de Primary school teacher VIH. El mdico tambin puede recomendarle un medicamento recetado para ayudar a evitar la infeccin por el VIH. Si elige tomar medicamentos para prevenir el VIH, primero debe [employer] de deteccin del VIH. Luego debe hacerse anlisis cada mientras est tomando los medicamentos. Siga estas instrucciones en su casa: Estilo de vida  No consuma ningn producto que contenga nicotina o tabaco, como cigarrillos, cigarrillos electrnicos y tabaco de Theatre manager. Si necesita ayuda para dejar de fumar, consulte al mdico.  No consuma drogas.  No comparta agujas.  Solicite ayuda a su mdico si necesita apoyo o informacin para abandonar las drogas. Consumo de alcohol  No beba alcohol si el mdico se lo prohbe.  Si bebe alcohol: ? Limite la cantidad que consume de 0 a 2 medidas por da. ? Est atento a la cantidad de alcohol que hay en las bebidas que toma. En los [place], una medida equivale a una botella de cerveza de 12oz ( ), un vaso de vino de 5oz ( ) o un vaso de una bebida alcohlica de alta graduacin de 1oz (30ml). Instrucciones  generales  Realcese los estudios de rutina de la salud, dentales y de Wellsite geologist.  Mantngase al da con las vacunas.  Infrmele a su mdico si: ? Se siente deprimido con frecuencia. ? Alguna vez ha sido vctima de [place] o no se siente seguro en su casa. Resumen  Adoptar un estilo de vida saludable y recibir atencin preventiva son importantes para promover la salud y Counsellor.  Siga las instrucciones del mdico acerca de una dieta saludable, el ejercicio y la realizacin de pruebas o exmenes para Hotel manager.  Siga las instrucciones del mdico con respecto al control del colesterol y la presin arterial. Esta informacin no tiene Theme park manager el consejo del mdico. Asegrese de hacerle al mdico cualquier pregunta que tenga. Document Revised: 08/01/2018 Document Reviewed: 08/01/2018 Elsevier Patient Education  2020 [employer].

## 2020-07-03 NOTE — Assessment & Plan Note (Addendum)
Chronic, uncontrolled since running out of lisinopril. Will restart.  Endorsed dyspnea could be side effect of uncontrolled hypertension.  Refill lisinopril 20mg  daily, RTC 1 mo HTN f/u.  Baseline EKG today.

## 2020-07-03 NOTE — Progress Notes (Signed)
Patient ID: Christian Weiss, male    DOB: 10-14-1970, 49 y.o.   MRN: [id number]  This visit was conducted in person.  BP (!) 142/100 (BP Location: Right Arm, Patient Position: Sitting, Cuff Size: Normal)   Pulse 76   Temp 97.8 F (36.6 C) (Temporal)   Ht 5' 7.5" (1.715 m)   Wt 177 lb 8 oz (80.5 kg)   SpO2 99%   BMI 27.39 kg/m   BP Readings from Last 3 Encounters:  07/03/20 (!) 142/100  07/09/15 (!) 143/99  10/16/13 (!) 142/94    CC: re establish care, CPE Subjective:   HPI: Christian Weiss is a 49 y.o. male presenting on 07/03/2020 for New Patient (Initial Visit) and Labs Only   Last seen 09/2013. Here to re establish care and for CPE.  Moved to [place] for 2 yrs, now back in the area.  Fasting today.   Notes some dyspnea since he received J&J vaccine, worse in afternoons. Notes left nasal congestion/obstruction. No h/o allergies or asthma. Ongoing symptoms for the past several months. Also correlates to when he ran out of lisinopril.   HTN - was on lisinopril 20mg  daily, but ran out 4 months ago. No HA, vision changes, CP/tightness, SOB, leg swelling.   ED for the past month. Trouble both maintaining and obtaining erection. Has been taking OTC male enhancer pill.   Preventative: Colon cancer screening - endorses chronic blood in stool since teenager. Agrees to colonoscopy.  Prostate - no fmhx  Flu shot - thinks already received COVID vaccine J&J 12/2019 Tetanus - remotely. Tdap today Seat belt use discussed  Sunscreen use discussed. No changing moles on skin  Non smoker Alcohol - none Dentist - due Eye exam - yearly new glasses  Caffeine: monster drinks  Lives with 2 daughters (1996, 1999)  Occupation: 06-11-1994 at [employer]  Activity: no regular exercise  Diet: good water, eats out a lot, fast food daily, doesn't cook     Relevant past medical, surgical, family and social history reviewed and updated as indicated. Interim medical history since our last  visit reviewed. Allergies and medications reviewed and updated. Outpatient Medications Prior to Visit  Medication Sig Dispense Refill  . lisinopril (ZESTRIL) 20 MG tablet Take 20 mg by mouth daily.    [employer] acetic acid-hydrocortisone (VOSOL-HC) otic solution Place 5 drops into the right ear 2 (two) times daily. For 5 days 10 mL 0  . ciprofloxacin (CIPRO) 500 MG tablet Take 1 tablet (500 mg total) by mouth 2 (two) times daily. 14 tablet 0  . omeprazole (PRILOSEC) 40 MG capsule Take 1 capsule (40 mg total) by mouth daily. 30 capsule 3   No facility-administered medications prior to visit.     Per HPI unless specifically indicated in ROS section below Review of Systems  Constitutional: Negative for activity change, appetite change, chills, fatigue, fever and unexpected weight change.  HENT: Negative for hearing loss.   Eyes: Negative for visual disturbance.  Respiratory: Positive for shortness of breath. Negative for cough, chest tightness and wheezing.   Cardiovascular: Negative for chest pain, palpitations and leg swelling.  Gastrointestinal: Positive for blood in stool (with wiping - chronic). Negative for abdominal distention, abdominal pain, constipation, diarrhea, nausea and vomiting.  Genitourinary: Negative for difficulty urinating and hematuria.  Musculoskeletal: Negative for arthralgias, myalgias and neck pain.  Skin: Negative for rash.  Neurological: Positive for headaches. Negative for dizziness, seizures and syncope.  Hematological: Negative for adenopathy. Does not bruise/bleed  easily.  Psychiatric/Behavioral: Negative for dysphoric mood. The patient is not nervous/anxious.    Objective:  BP (!) 142/100 (BP Location: Right Arm, Patient Position: Sitting, Cuff Size: Normal)   Pulse 76   Temp 97.8 F (36.6 C) (Temporal)   Ht 5' 7.5" (1.715 m)   Wt 177 lb 8 oz (80.5 kg)   SpO2 99%   BMI 27.39 kg/m   Wt Readings from Last 3 Encounters:  07/03/20 177 lb 8 oz (80.5 kg)   07/09/15 162 lb (73.5 kg)  10/16/13 170 lb 8 oz (77.3 kg)      Physical Exam Vitals and nursing note reviewed.  Constitutional:      General: He is not in acute distress.    Appearance: Normal appearance. He is well-developed and well-nourished. He is not ill-appearing.  HENT:     Head: Normocephalic and atraumatic.     Right Ear: Hearing, tympanic membrane, ear canal and external ear normal.     Left Ear: Hearing, tympanic membrane, ear canal and external ear normal.     Nose: Nose normal. No congestion or rhinorrhea.     Mouth/Throat:     Mouth: Oropharynx is clear and moist and mucous membranes are normal.     Pharynx: Uvula midline. No posterior oropharyngeal edema.  Eyes:     General: No scleral icterus.    Extraocular Movements: Extraocular movements intact and EOM normal.     Conjunctiva/sclera: Conjunctivae normal.     Pupils: Pupils are equal, round, and reactive to light.  Cardiovascular:     Rate and Rhythm: Normal rate and regular rhythm.     Pulses: Normal pulses and intact distal pulses.          Radial pulses are 2+ on the right side and 2+ on the left side.     Heart sounds: Normal heart sounds. No murmur heard.   Pulmonary:     Effort: Pulmonary effort is normal. No respiratory distress.     Breath sounds: Normal breath sounds. No wheezing, rhonchi or rales.  Abdominal:     General: Abdomen is flat. Bowel sounds are normal. There is no distension.     Palpations: Abdomen is soft. There is no mass.     Tenderness: There is no abdominal tenderness. There is no guarding or rebound.     Hernia: No hernia is present.  Musculoskeletal:        General: No edema. Normal range of motion.     Cervical back: Normal range of motion and neck supple.     Right lower leg: No edema.     Left lower leg: No edema.  Lymphadenopathy:     Cervical: No cervical adenopathy.  Skin:    General: Skin is warm and dry.     Findings: No rash.  Neurological:     General: No focal  deficit present.     Mental Status: He is alert and oriented to person, place, and time.     Comments: CN grossly intact, station and gait intact  Psychiatric:        Mood and Affect: Mood and affect and mood normal.        Behavior: Behavior normal.        Thought Content: Thought content normal.        Judgment: Judgment normal.       Results for orders placed or performed in visit on 07/03/20  Lipid panel  Result Value Ref Range   Cholesterol 195 0 -  200 mg/dL   Triglycerides 161.0 (H) 0.0 - 149.0 mg/dL   HDL 96.04 >54.09 mg/dL   VLDL 81.1 (H) 0.0 - 91.4 mg/dL   Total CHOL/HDL Ratio 5    NonHDL 152.49   Comprehensive metabolic panel  Result Value Ref Range   Sodium 141 135 - 145 mEq/L   Potassium 4.2 3.5 - 5.1 mEq/L   Chloride 104 96 - 112 mEq/L   CO2 30 19 - 32 mEq/L   Glucose, Bld 90 70 - 99 mg/dL   BUN 10 6 - 23 mg/dL   Creatinine, Ser 7.82 0.40 - 1.50 mg/dL   Total Bilirubin 0.5 0.2 - 1.2 mg/dL   Alkaline Phosphatase 65 39 - 117 U/L   AST 20 0 - 37 U/L   ALT 23 0 - 53 U/L   Total Protein 6.8 6.0 - 8.3 g/dL   Albumin 4.3 3.5 - 5.2 g/dL   GFR 956.21 >30.86 mL/min   Calcium 9.0 8.4 - 10.5 mg/dL  TSH  Result Value Ref Range   TSH 2.26 0.35 - 4.50 uIU/mL  Microalbumin / creatinine urine ratio  Result Value Ref Range   Microalb, Ur 1.1 0.0 - 1.9 mg/dL   Creatinine,U 578.4 mg/dL   Microalb Creat Ratio 0.6 0.0 - 30.0 mg/g  LDL cholesterol, direct  Result Value Ref Range   Direct LDL 119.0 mg/dL   EKG - NSR rate 69G, normal axis, intervals, no acute ST/T changes Assessment & Plan:  This visit occurred during the SARS-CoV-2 public health emergency.  Safety protocols were in place, including screening questions prior to the visit, additional usage of staff PPE, and extensive cleaning of exam room while observing appropriate contact time as indicated for disinfecting solutions.   Problem List Items Addressed This Visit    Primary hypertension    Chronic, uncontrolled  since running out of lisinopril. Will restart.  Endorsed dyspnea could be side effect of uncontrolled hypertension.  Refill lisinopril 20mg  daily, RTC 1 mo HTN f/u.  Baseline EKG today.       Relevant Medications   lisinopril (ZESTRIL) 20 MG tablet   sildenafil (VIAGRA) 50 MG tablet   Other Relevant Orders   Lipid panel (Completed)   Comprehensive metabolic panel (Completed)   TSH (Completed)   Microalbumin / creatinine urine ratio (Completed)   EKG 12-Lead (Completed)   Erectile dysfunction    Trial viagra.  Reviewed side effect of HA, monitor for priapism, let know right away if chest pain develops.  Reassess after better BP control.  Advised stop OTC male enhancer supplement.       Encounter for general adult medical examination with abnormal findings - Primary    Preventative protocols reviewed and updated unless pt declined. Discussed healthy diet and lifestyle.       Dyspnea    Notes trouble getting deep breaths since J&J vaccine 12/2019 - symptoms seem to also correlate to when he ran out of lisinopril. Will optimize BP control and reassess symptoms.        Other Visit Diagnoses    Special screening for malignant neoplasms, colon       Relevant Orders   Ambulatory referral to Gastroenterology   Need for Tdap vaccination       Relevant Orders   Tdap vaccine greater than or equal to 7yo IM (Completed)       Meds ordered this encounter  Medications  . lisinopril (ZESTRIL) 20 MG tablet    Sig: Take 1 tablet (20 mg  total) by mouth daily.    Dispense:  90 tablet    Refill:  3  . sildenafil (VIAGRA) 50 MG tablet    Sig: Take 1 tablet (50 mg total) by mouth daily as needed for erectile dysfunction.    Dispense:  10 tablet    Refill:  0   Orders Placed This Encounter  Procedures  . Tdap vaccine greater than or equal to 7yo IM  . Lipid panel  . Comprehensive metabolic panel  . TSH  . Microalbumin / creatinine urine ratio  . LDL cholesterol, direct  .  Ambulatory referral to Gastroenterology    Referral Priority:   Routine    Referral Type:   Consultation    Referral Reason:   Specialty Services Required    Number of Visits Requested:   1  . EKG 12-Lead    Patient instructions: Return as needed or in 1 year for next physical.  Tdap today  laborator[facility] hoy.  EKG today  Comienze lisinopril 20mg  de nuevo  Regresar en 1 mes para seguimiento de presion arterial.   Follow up plan: Return in about 4 weeks (around 07/31/2020) for follow up visit.  09/28/2020, MD

## 2020-07-03 NOTE — Assessment & Plan Note (Addendum)
Trial viagra.  Reviewed side effect of HA, monitor for priapism, let us know right away if chest pain develops.  Reassess after better BP control.  Advised stop OTC male enhancer supplement.

## 2020-07-04 DIAGNOSIS — R0600 Dyspnea, unspecified: Secondary | ICD-10-CM | POA: Insufficient documentation

## 2020-07-04 NOTE — Assessment & Plan Note (Signed)
Notes trouble getting deep breaths since J&J vaccine 12/2019 - symptoms seem to also correlate to when he ran out of lisinopril. Will optimize BP control and reassess symptoms.

## 2021-05-25 ENCOUNTER — Telehealth: Payer: Self-pay | Admitting: Family Medicine

## 2021-05-25 MED ORDER — LISINOPRIL 20 MG PO TABS: 20.0000 mg | ORAL_TABLET | Freq: Every day | ORAL | 0 refills | Status: DC

## 2021-05-25 NOTE — Telephone Encounter (Signed)
  Encourage patient to contact the pharmacy for refills or they can request refills through [facility]  LAST APPOINTMENT DATE:  Please schedule appointment if longer than 1 year  NEXT APPOINTMENT DATE:  MEDICATION:lisinopril  Is the patient out of medication? yes  PH[facility]ACY:[facility] in [place]  Let patient know to contact pharmacy at the end of the day to make sure medication is ready.  Please notify patient to allow 48-72 hours to process  CLINICAL FILLS OUT ALL BELOW:   LAST REFILL:  QTY:  REFILL DATE:    OTHER COMMENTS:    Okay for refill?  Please advise

## 2021-05-25 NOTE — Telephone Encounter (Signed)
Lvm for pt to call the office to get scheduled for lab/cpe 

## 2021-05-25 NOTE — Telephone Encounter (Signed)
E-scribed refill.  Plz schedule lab (at [facility]) and cpe at [facility].

## 2021-05-26 NOTE — Telephone Encounter (Signed)
2nd attempt  Lm to schedule

## 2021-07-07 ENCOUNTER — Telehealth: Payer: Self-pay | Admitting: Family Medicine

## 2021-07-07 NOTE — Telephone Encounter (Signed)
This was already refilled please see other refill request phone note. Thank you

## 2021-07-07 NOTE — Telephone Encounter (Signed)
°  Encourage patient to contact the pharmacy for refills or they can request refills through [facility]  LAST APPOINTMENT DATE:  Please schedule appointment if longer than 1 year  NEXT APPOINTMENT DATE:  MEDICATION:lisinopril (ZESTRIL) 20 MG tablet   Is the patient out of medication?   PHARMACY:[facility] [id number] -    Let patient know to contact pharmacy at the end of the day to make sure medication is ready.  Please notify patient to allow 48-72 hours to process  CLINICAL FILLS OUT ALL BELOW:   LAST REFILL:  QTY:  REFILL DATE:    OTHER COMMENTS:    Okay for refill?  Please advise

## 2021-08-20 ENCOUNTER — Ambulatory Visit: Payer: Managed Care, Other (non HMO) | Admitting: Family Medicine

## 2021-08-24 ENCOUNTER — Other Ambulatory Visit: Payer: Self-pay

## 2021-08-24 ENCOUNTER — Encounter: Payer: Self-pay | Admitting: Family Medicine

## 2021-08-24 ENCOUNTER — Ambulatory Visit (INDEPENDENT_AMBULATORY_CARE_PROVIDER_SITE_OTHER): Payer: Managed Care, Other (non HMO) | Admitting: Family Medicine

## 2021-08-24 VITALS — BP 136/84 | HR 75 | Temp 97.5°F | Ht 67.5 in | Wt 176.2 lb

## 2021-08-24 DIAGNOSIS — R6882 Decreased libido: Secondary | ICD-10-CM | POA: Diagnosis not present

## 2021-08-24 DIAGNOSIS — Z23 Encounter for immunization: Secondary | ICD-10-CM | POA: Diagnosis not present

## 2021-08-24 DIAGNOSIS — N529 Male erectile dysfunction, unspecified: Secondary | ICD-10-CM

## 2021-08-24 DIAGNOSIS — I10 Essential (primary) hypertension: Secondary | ICD-10-CM

## 2021-08-24 LAB — BASIC METABOLIC PANEL
BUN: 13 mg/dL (ref 6–23)
CO2: 31 mEq/L (ref 19–32)
Calcium: 9.3 mg/dL (ref 8.4–10.5)
Chloride: 104 mEq/L (ref 96–112)
Creatinine, Ser: 0.7 mg/dL (ref 0.40–1.50)
GFR: 108.93 mL/min (ref >60.00)
Glucose, Bld: 93 mg/dL (ref 70–99)
Potassium: 4.1 mEq/L (ref 3.5–5.1)
Sodium: 141 mEq/L (ref 135–145)

## 2021-08-24 LAB — TESTOSTERONE: Testosterone: 227 ng/dL — ABNORMAL LOW (ref 300.00–890.00)

## 2021-08-24 MED ORDER — LISINOPRIL 20 MG PO TABS: 20.0000 mg | ORAL_TABLET | Freq: Every day | ORAL | 4 refills | Status: DC

## 2021-08-24 MED ORDER — SILDENAFIL CITRATE 100 MG PO TABS: 100.0000 mg | ORAL_TABLET | Freq: Every day | ORAL | 3 refills | Status: DC | PRN

## 2021-08-24 NOTE — Patient Instructions (Addendum)
Vacuna de flu hoy.  Laboratorios hoy Siga lisinopril. He rellenado viagra 100mg  hoy.

## 2021-08-24 NOTE — Progress Notes (Signed)
Patient ID: Christian Weiss, male    DOB: Apr 23, 1971, 51 y.o.   MRN: [id number]  This visit was conducted in person.  BP 136/84    Pulse 75    Temp (!) 97.5 F (36.4 C) (Temporal)    Ht 5' 7.5" (1.715 m)    Wt 176 lb 3 oz (79.9 kg)    SpO2 99%    BMI 27.19 kg/m    CC: HTN f/u visit  Subjective:   HPI: Christian Weiss is a 51 y.o. male presenting on 08/24/2021 for Hypertension (Here for f/u.)   Last seen 06/2020.  Mistakenly went to [facility] last week for appt so missed it then.   HTN - Compliant with current antihypertensive regimen of lisinopril 20mg  daily.  Does check blood pressures at home: well controlled. No low blood pressure readings or symptoms of dizziness/syncope. Denies HA, vision changes, CP/tightness, SOB, leg swelling.   Seen at [facility] for bacterial gastroenteritis treated with cipro and flagyl x5 days with significant improvement. Also used zofran PRN nausea.   ED trouble obtaining and maintaining erection, longstanding and worsening - previous trial viagra 50mg  was not effective. Tolerated med well without side effect. Notes decreased sex drive as well as fatigue and depressed mood - associated with decreased sex drive.      Relevant past medical, surgical, family and social history reviewed and updated as indicated. Interim medical history since our last visit reviewed. Allergies and medications reviewed and updated. Outpatient Medications Prior to Visit  Medication Sig Dispense Refill   lisinopril (ZESTRIL) 20 MG tablet Take 1 tablet (20 mg total) by mouth daily. 90 tablet 0   sildenafil (VIAGRA) 50 MG tablet Take 1 tablet (50 mg total) by mouth daily as needed for erectile dysfunction. (Patient not taking: Reported on 08/24/2021) 10 tablet 0   No facility-administered medications prior to visit.     Per HPI unless specifically indicated in ROS section below Review of Systems  Objective:  BP 136/84    Pulse 75    Temp (!) 97.5 F (36.4 C)  (Temporal)    Ht 5' 7.5" (1.715 m)    Wt 176 lb 3 oz (79.9 kg)    SpO2 99%    BMI 27.19 kg/m   Wt Readings from Last 3 Encounters:  08/24/21 176 lb 3 oz (79.9 kg)  07/03/20 177 lb 8 oz (80.5 kg)  07/09/15 162 lb (73.5 kg)      Physical Exam Vitals and nursing note reviewed.  Constitutional:      Appearance: Normal appearance. He is not ill-appearing.  Neck:     Thyroid: No thyroid mass or thyromegaly.  Cardiovascular:     Rate and Rhythm: Normal rate and regular rhythm.     Pulses: Normal pulses.     Heart sounds: Normal heart sounds. No murmur heard. Pulmonary:     Effort: Pulmonary effort is normal. No respiratory distress.     Breath sounds: Normal breath sounds. No wheezing, rhonchi or rales.  Abdominal:     Hernia: There is no hernia in the left inguinal area or right inguinal area.  Genitourinary:    Pubic Area: No rash.      Penis: Normal and uncircumcised.      Testes: Normal.        Right: Mass, tenderness or swelling not present.        Left: Mass, tenderness or swelling not present.     Epididymis:  Right: Normal.     Left: Normal.  Musculoskeletal:     Right lower leg: No edema.     Left lower leg: No edema.  Lymphadenopathy:     Lower Body: No right inguinal adenopathy. No left inguinal adenopathy.  Skin:    General: Skin is warm and dry.     Findings: No rash.  Neurological:     Mental Status: He is alert.      Results for orders placed or performed in visit on 07/03/20  Lipid panel  Result Value Ref Range   Cholesterol 195 0 - 200 mg/dL   Triglycerides 960.4213.0 (H) 0.0 - 149.0 mg/dL   HDL 54.0942.10 >81.19>39.00 mg/dL   VLDL 14.742.6 (H) 0.0 - 82.940.0 mg/dL   Total CHOL/HDL Ratio 5    NonHDL 152.49   Comprehensive metabolic panel  Result Value Ref Range   Sodium 141 135 - 145 mEq/L   Potassium 4.2 3.5 - 5.1 mEq/L   Chloride 104 96 - 112 mEq/L   CO2 30 19 - 32 mEq/L   Glucose, Bld 90 70 - 99 mg/dL   BUN 10 6 - 23 mg/dL   Creatinine, Ser 5.620.70 0.40 - 1.50 mg/dL    Total Bilirubin 0.5 0.2 - 1.2 mg/dL   Alkaline Phosphatase 65 39 - 117 U/L   AST 20 0 - 37 U/L   ALT 23 0 - 53 U/L   Total Protein 6.8 6.0 - 8.3 g/dL   Albumin 4.3 3.5 - 5.2 g/dL   GFR 130.86109.81 >57.84>60.00 mL/min   Calcium 9.0 8.4 - 10.5 mg/dL  TSH  Result Value Ref Range   TSH 2.26 0.35 - 4.50 uIU/mL  Microalbumin / creatinine urine ratio  Result Value Ref Range   Microalb, Ur 1.1 0.0 - 1.9 mg/dL   Creatinine,U 696.2169.8 mg/dL   Microalb Creat Ratio 0.6 0.0 - 30.0 mg/g  LDL cholesterol, direct  Result Value Ref Range   Direct LDL 119.0 mg/dL    Assessment & Plan:  This visit occurred during the SARS-CoV-2 public health emergency.  Safety protocols were in place, including screening questions prior to the visit, additional usage of staff PPE, and extensive cleaning of exam room while observing appropriate contact time as indicated for disinfecting solutions.   Problem List Items Addressed This Visit     Primary hypertension - Primary    Chronic, stable on current regimen - continue lisinopril 10mg  daily. Update BMP today.       Relevant Medications   lisinopril (ZESTRIL) 20 MG tablet   sildenafil (VIAGRA) 100 MG tablet   Other Relevant Orders   Basic metabolic panel   Erectile dysfunction    Ongoing difficulty - see below. viagra 50mg  was not very effective - will trial 100mg  dose. Reviewed side effects to monitor including HA, priapism.       Decreased sex drive    Endorsing ongoing ED along with decreased libido, increased fatigue, and depressed mood largely related to decreased sex drive. Normal GU exam today. Will check baseline testosterone levels this morning. Discussed if low, would need to return for confirmatory testing.       Relevant Orders   Testosterone   Other Visit Diagnoses     Need for influenza vaccination       Relevant Orders   Flu Vaccine QUAD 1375mo+IM (Fluarix, Fluzone & Alfiuria Quad PF) (Completed)        Meds ordered this encounter  Medications    lisinopril (ZESTRIL) 20 MG tablet  Sig: Take 1 tablet (20 mg total) by mouth daily.    Dispense:  90 tablet    Refill:  4   sildenafil (VIAGRA) 100 MG tablet    Sig: Take 1 tablet (100 mg total) by mouth daily as needed for erectile dysfunction.    Dispense:  10 tablet    Refill:  3   Orders Placed This Encounter  Procedures   Flu Vaccine QUAD 62mo+IM (Fluarix, Fluzone & Alfiuria Quad PF)   Testosterone   Basic metabolic panel     Patient Instructions  Vacuna de flu hoy.  Laboratorios hoy Siga lisinopril. He rellenado viagra 100mg  hoy.   Follow up plan: Return if symptoms worsen or fail to improve.  , MD

## 2021-08-24 NOTE — Assessment & Plan Note (Signed)
Ongoing difficulty - see below. viagra 50mg  was not very effective - will trial 100mg  dose. Reviewed side effects to monitor including HA, priapism.

## 2021-08-24 NOTE — Assessment & Plan Note (Addendum)
Endorsing ongoing ED along with decreased libido, increased fatigue, and depressed mood largely related to decreased sex drive. Normal GU exam today. Will check baseline testosterone levels this morning. Discussed if low, would need to return for confirmatory testing.

## 2021-08-24 NOTE — Assessment & Plan Note (Signed)
Chronic, stable on current regimen - continue lisinopril 10mg  daily. Update BMP today.

## 2021-08-26 ENCOUNTER — Other Ambulatory Visit: Payer: Self-pay | Admitting: Family Medicine

## 2021-08-26 DIAGNOSIS — E291 Testicular hypofunction: Secondary | ICD-10-CM

## 2021-09-03 ENCOUNTER — Other Ambulatory Visit: Payer: Self-pay

## 2021-09-03 ENCOUNTER — Other Ambulatory Visit (INDEPENDENT_AMBULATORY_CARE_PROVIDER_SITE_OTHER): Payer: Managed Care, Other (non HMO)

## 2021-09-03 DIAGNOSIS — E291 Testicular hypofunction: Secondary | ICD-10-CM | POA: Diagnosis not present

## 2021-09-03 LAB — COMPREHENSIVE METABOLIC PANEL
ALT: 29 U/L (ref 0–53)
AST: 23 U/L (ref 0–37)
Albumin: 4.1 g/dL (ref 3.5–5.2)
Alkaline Phosphatase: 72 U/L (ref 39–117)
BUN: 14 mg/dL (ref 6–23)
CO2: 33 mEq/L — ABNORMAL HIGH (ref 19–32)
Calcium: 9 mg/dL (ref 8.4–10.5)
Chloride: 104 mEq/L (ref 96–112)
Creatinine, Ser: 0.66 mg/dL (ref 0.40–1.50)
GFR: 110.86 mL/min (ref >60.00)
Glucose, Bld: 90 mg/dL (ref 70–99)
Potassium: 4.1 mEq/L (ref 3.5–5.1)
Sodium: 142 mEq/L (ref 135–145)
Total Bilirubin: 0.6 mg/dL (ref 0.2–1.2)
Total Protein: 6.8 g/dL (ref 6.0–8.3)

## 2021-09-03 LAB — T4, FREE: Free T4: 0.66 ng/dL (ref 0.60–1.60)

## 2021-09-03 LAB — LUTEINIZING HORMONE: LH: 3.08 m[IU]/mL (ref 1.50–9.30)

## 2021-09-03 LAB — LIPID PANEL
Cholesterol: 184 mg/dL (ref 0–200)
HDL: 47.9 mg/dL (ref >39.00)
LDL Cholesterol: 122 mg/dL — ABNORMAL HIGH (ref 0–99)
NonHDL: 135.62
Total CHOL/HDL Ratio: 4
Triglycerides: 69 mg/dL (ref 0.0–149.0)
VLDL: 13.8 mg/dL (ref 0.0–40.0)

## 2021-09-03 LAB — IBC PANEL
Iron: 122 ug/dL (ref 42–165)
Saturation Ratios: 40.3 % (ref 20.0–50.0)
TIBC: 302.4 ug/dL (ref 250.0–450.0)
Transferrin: 216 mg/dL (ref 212.0–360.0)

## 2021-09-03 LAB — FOLLICLE STIMULATING HORMONE: FSH: 3.7 m[IU]/mL (ref 1.4–18.1)

## 2021-09-03 LAB — PSA: PSA: 0.58 ng/mL (ref 0.10–4.00)

## 2021-09-03 LAB — TSH: TSH: 1.53 u[IU]/mL (ref 0.35–5.50)

## 2021-09-13 LAB — TESTOS,TOTAL,FREE AND SHBG (FEMALE)
Free Testosterone: 82 pg/mL (ref 35.0–155.0)
Sex Hormone Binding: 32 nmol/L (ref 10–50)
Testosterone, Total, LC-MS-MS: 436 ng/dL (ref 250–1100)

## 2021-11-15 ENCOUNTER — Encounter: Payer: Self-pay | Admitting: Family Medicine

## 2021-11-15 ENCOUNTER — Ambulatory Visit (INDEPENDENT_AMBULATORY_CARE_PROVIDER_SITE_OTHER): Payer: Managed Care, Other (non HMO) | Admitting: Family Medicine

## 2021-11-15 VITALS — BP 136/86 | HR 70 | Temp 97.7°F | Ht 67.5 in | Wt 176.1 lb

## 2021-11-15 DIAGNOSIS — K602 Anal fissure, unspecified: Secondary | ICD-10-CM | POA: Insufficient documentation

## 2021-11-15 DIAGNOSIS — K5900 Constipation, unspecified: Secondary | ICD-10-CM | POA: Diagnosis not present

## 2021-11-15 MED ORDER — POLYETHYLENE GLYCOL 3350 17 GM/SCOOP PO POWD: 17.0000 g | Freq: Every day | ORAL | 0 refills | Status: DC | PRN

## 2021-11-15 MED ORDER — NONFORMULARY OR COMPOUNDED ITEM: 1 refills | Status: DC

## 2021-11-15 NOTE — Progress Notes (Signed)
? ? Patient ID: Christian Weiss, male    DOB: 06-18-1971, 51 y.o.   MRN: [id number] ? ?This visit was conducted in person. ? ?BP 136/86   Pulse 70   Temp 97.7 ?F (36.5 ?C) (Temporal)   Ht 5' 7.5" (1.715 m)   Wt 176 lb 2 oz (79.9 kg)   SpO2 100%   BMI 27.18 kg/m?   ? ?CC: constipation  ?Subjective:  ? ?HPI: ?Christian Weiss is a 51 y.o. male presenting on 11/15/2021 for Constipation (C/o constipation.  Started about 3 mos ago after starting cipro and metronidazole for an infection. ) ? ? ?[facility] treated with cipro and flagyl x5 days - since then notes significant constipation with significant straining and pain with defecation.  ?No abd pain, fevers/chills, nausea/vomiting.  ?He's been using prepH with some benefit. Hasn't tried anything else so far.  ?Normally 2 formed stools/day.  ? ?Normal water intake - drinks 3-4 water bottles/day.  ?No significant fiber in diet.  ?   ? ?Relevant past medical, surgical, family and social history reviewed and updated as indicated. Interim medical history since our last visit reviewed. ?Allergies and medications reviewed and updated. ?Outpatient Medications Prior to Visit  ?Medication Sig Dispense Refill  ? lisinopril (ZESTRIL) 20 MG tablet Take 1 tablet (20 mg total) by mouth daily. 90 tablet 4  ? sildenafil (VIAGRA) 100 MG tablet Take 1 tablet (100 mg total) by mouth daily as needed for erectile dysfunction. 10 tablet 3  ? ?No facility-administered medications prior to visit.  ?  ? ?Per HPI unless specifically indicated in ROS section below ?Review of Systems ? ?Objective:  ?BP 136/86   Pulse 70   Temp 97.7 ?F (36.5 ?C) (Temporal)   Ht 5' 7.5" (1.715 m)   Wt 176 lb 2 oz (79.9 kg)   SpO2 100%   BMI 27.18 kg/m?   ?Wt Readings from Last 3 Encounters:  ?11/15/21 176 lb 2 oz (79.9 kg)  ?08/24/21 176 lb 3 oz (79.9 kg)  ?07/03/20 177 lb 8 oz (80.5 kg)  ?  ?  ?Physical Exam ?Vitals and nursing note reviewed.  ?Constitutional:   ?    Appearance: Normal appearance. He is not ill-appearing.  ?Abdominal:  ?   General: Bowel sounds are normal. There is no distension.  ?   Palpations: Abdomen is soft. There is no mass.  ?   Tenderness: There is no abdominal tenderness. There is no guarding or rebound.  ?   Hernia: No hernia is present.  ?Genitourinary: ?   Rectum: Tenderness and anal fissure present. No mass, external hemorrhoid or internal hemorrhoid.  ?   Comments:  ?Excess perianal skin without actively inflamed external hemorrhoid ?Tender posterior anus with maceration and possible fissure  ?No current bleed  ?Musculoskeletal:  ?   Right lower leg: No edema.  ?   Left lower leg: No edema.  ?Skin: ?   General: Skin is warm and dry.  ?   Findings: No rash.  ?Neurological:  ?   Mental Status: He is alert.  ?Psychiatric:     ?   Mood and Affect: Mood normal.     ?   Behavior: Behavior normal.  ? ?   ?Results for orders placed or performed in visit on 09/03/21  ?Testos,Total,Free and SHBG (Male)  ?Result Value Ref Range  ? Testosterone, Total, LC-MS-MS 436 250 - 1,100 ng/dL  ? Free Testosterone 82.0 35.0 - 155.0 pg/mL  ?  Sex Hormone Binding 32 10 - 50 nmol/L  ?Luteinizing hormone  ?Result Value Ref Range  ? LH 3.08 1.50 - 9.30 mIU/mL  ?Follicle Stimulating Hormone  ?Result Value Ref Range  ? FSH 3.7 1.4 - 18.1 mIU/ML  ?IBC panel  ?Result Value Ref Range  ? Iron 122 42 - 165 ug/dL  ? Transferrin 216.0 212.0 - 360.0 mg/dL  ? Saturation Ratios 40.3 20.0 - 50.0 %  ? TIBC 302.4 250.0 - 450.0 mcg/dL  ?Lipid panel  ?Result Value Ref Range  ? Cholesterol 184 0 - 200 mg/dL  ? Triglycerides 69.0 0.0 - 149.0 mg/dL  ? HDL 47.90 >39.00 mg/dL  ? VLDL 13.8 0.0 - 40.0 mg/dL  ? LDL Cholesterol 122 (H) 0 - 99 mg/dL  ? Total CHOL/HDL Ratio 4   ? NonHDL 135.62   ?Comprehensive metabolic panel  ?Result Value Ref Range  ? Sodium 142 135 - 145 mEq/L  ? Potassium 4.1 3.5 - 5.1 mEq/L  ? Chloride 104 96 - 112 mEq/L  ? CO2 33 (H) 19 - 32 mEq/L  ? Glucose, Bld 90 70 - 99 mg/dL   ? BUN 14 6 - 23 mg/dL  ? Creatinine, Ser 0.66 0.40 - 1.50 mg/dL  ? Total Bilirubin 0.6 0.2 - 1.2 mg/dL  ? Alkaline Phosphatase 72 39 - 117 U/L  ? AST 23 0 - 37 U/L  ? ALT 29 0 - 53 U/L  ? Total Protein 6.8 6.0 - 8.3 g/dL  ? Albumin 4.1 3.5 - 5.2 g/dL  ? GFR 110.86 >60.00 mL/min  ? Calcium 9.0 8.4 - 10.5 mg/dL  ?T4, free  ?Result Value Ref Range  ? Free T4 0.66 0.60 - 1.60 ng/dL  ?TSH  ?Result Value Ref Range  ? TSH 1.53 0.35 - 5.50 uIU/mL  ?PSA  ?Result Value Ref Range  ? PSA 0.58 0.10 - 4.00 ng/mL  ? ? ?Assessment & Plan:  ? ?Problem List Items Addressed This Visit   ? ? Constipation  ?  Chronic constipation that started after abx course (cipro/flagyl) 06/2021. Anticipate abx related change in gut flora leading to constipation. rec start probiotic, discussed bowel regimen - increased fiber, water, Rx miralax.  ?No abd pain - not consistent with IBS or diverticulitis.  ?He is overdue for colonoscopy - will refer - but discussed anal fissure needs to heal prior to undergoing luminal evaluation.  ? ?  ?  ? Anal fissure  ?  Suspect posterior anal fissure - discussed symptoms and management - rec sitz baths, start 0.125% nitroglycerin ointment TID PRN, and treat constipation.  ?Reviewed possible side effects of nitro including HA and orthostatic hypotension - emphasized need to avoid viagra while on nitro.  ?Update if not improving with treatment for GI referral.  ? ?  ?  ?  ? ?Meds ordered this encounter  ?Medications  ? NONFORMULARY OR COMPOUNDED ITEM  ?  Sig: Nitroglycerin ointment 0.125% apply pea sized amount internally three times daily, Disp 30 GM, 1 refill  ?  Dispense:  30 each  ?  Refill:  1  ? polyethylene glycol powder (GLYCOLAX/MIRALAX) 17 GM/SCOOP powder  ?  Sig: Take 17 g by mouth daily as needed for moderate constipation (hold for diarrhea).  ?  Dispense:  3350 g  ?  Refill:  0  ? ?No orders of the defined types were placed in this encounter. ? ? ?Patient instructions: ?Creo que tiene fisura anal - tome  medicine que he mandado a Garment/textile technologist.  ?  [place] cantidad de [place] y agua en la dieta ?Puede usar miralax polvo 1/2-1 tapa entera disuelta en agua diaria, pare si le da diarrhea.  ?Tome probiotico [employer] [facility] o Align - puede comprar esto sin receta para re establecer bacteria sana en los intestinos.  ?Lo remitiremos a gastroenterologo para colonoscopia. Puede llamar a [facility] [phone] para hacer cita.  ?No use viagra con nitroglycerina ? ?Follow up plan: ?Return if symptoms worsen or fail to improve. ? ?Eustaquio Boyden, MD   ?

## 2021-11-15 NOTE — Assessment & Plan Note (Addendum)
Suspect posterior anal fissure - discussed symptoms and management - rec sitz baths, start 0.125% nitroglycerin ointment TID PRN, and treat constipation.  ?Reviewed possible side effects of nitro including HA and orthostatic hypotension - emphasized need to avoid viagra while on nitro.  ?Update if not improving with treatment for GI referral.  ?

## 2021-11-15 NOTE — Patient Instructions (Addendum)
Creo que tiene fisura anal - tome medicine que he mandado a Engineer, building services.  ?[place] cantidad de [place] y agua en la dieta ?Puede usar miralax polvo 1/2-1 tapa entera disuelta en agua diaria, pare si le da diarrhea.  ?Tome probiotico [employer] [facility] o Align - puede comprar esto sin receta para re establecer bacteria sana en los intestinos.  ?Lo remitiremos a gastroenterologo para colonoscopia. Puede llamar a [facility] [phone] para hacer cita.  ?No use viagra con nitroglycerina ? ?Fisura anal en los adultos ?Anal Fissure, Adult ? ?Una fisura anal es un peque?o desgarro o corte en el tejido del ano. El sangrado proveniente de una fisura suele detenerse solo despu?s de algunos minutos. Sin embargo, a menudo habr? un nuevo sangrado con cada deposici?n hasta que la fisura cicatrice. ??Cu?les son las causas? ?Generalmente, la causa de esta afecci?n es la evacuaci?n de material fecal (heces) dura o voluminosa. Otras causas son: ?Estre?imiento. ?Diarrea frecuente. ?Enfermedad inflamatoria del intestino (enfermedad de Crohn o colitis ulcerosa). ?Parto. ?Infecciones. ?Sexo anal. ??Cu?les son los signos o s?ntomas? ?Los s?ntomas de esta afecci?n incluyen: ?Sangrado que proviene del recto. ?Peque?as cantidades de sangre que se observan en las heces, en el papel higi?nico o en el inodoro despu?s de defecar. La sangre recubre el exterior de las heces y no est? Print production planner. ?Tener dolor al defecar. ?Picaz?n o irritaci?n alrededor del ano. ??C?mo se diagnostica? ?El m?dico puede diagnosticar esta afecci?n con un examen exhaustivo de la zona anal. Baxter Flattery revisi?n cuidadosa permite observar la fisura anal. En algunos casos, se puede realizar un examen rectal, o bien utilizar un tubo corto (anoscopio) para examinar el conducto anal. ??C?mo se trata? ?El tratamiento inicial de esta afecci?n puede incluir lo siguiente: ?Tomar medidas para evitar el estre?imiento. Pueden incluir cambios en la dieta, por ejemplo,  aumentar la ingesta de fibras o de l?quidos. ?Tomar suplementos de fibra. Estos suplementos cuales pueden ablandar las heces y ayudar a Museum/gallery exhibitions officer defecaci?n. El m?dico tambi?n puede recetarle un laxante si las heces son duras. ?Tomar ba?os de asiento. Esto puede ayudar a Marine scientist. ?Usar cremas o ung?entos medicinales. Se pueden recetar para [employer]. ?Los tratamientos que a veces se usan si los tratamientos iniciales no funcionan bien o si la afecci?n es m?s grave pueden incluir: ?Inyecci?n de toxina botul?nica. ?Cirug?a para reparar la fisura. ?Siga estas indicaciones en su casa: ?Comida y bebida ? ?Evite los alimentos que pueden causar estre?imiento, como las bananas, la [place] y otros productos l?cteos. ?Coma todo tipo de frutas, excepto bananas. ?Beba suficiente l?quido como para mantener la orina de color amarillo p?lido. ?Consuma alimentos ricos en fibra, como frijoles, cereales integrales, y frutas y verduras frescas. ?Indicaciones generales ? ?Use los medicamentos de venta libre y los recetados solamente como se lo haya indicado el m?dico. ?Apl?quese cremas o ung?entos como se lo haya indicado el m?dico. ?Mantenga la zona anal limpia y seca. ?Tome ba?os de [employer] se lo haya indicado el m?dico. No use jab?n en los ba?os de asiento. ?Concurra a todas las visitas de seguimiento como se lo haya indicado el m?dico. Esto es importante. ?Comun?quese con un m?dico si tiene: ?M?s sangrado. ?Cristy Hilts. ?Diarrea mezclada con sangre. ?Dolor que no se [place]. ?Problemas continuos que empeoran en vez de mejorar. ?Resumen ?Una fisura anal es un peque?o desgarro o corte en el tejido del ano. Generalmente, la causa de esta afecci?n es la evacuaci?n de material fecal (heces) dura o voluminosa. Otras de  las causas son estre?imiento y diarrea frecuente. ?El tratamiento inicial de esta afecci?n puede incluir tomar medidas para evitar el estre?imiento, como aumentar el consumo de [place] o de  l?quidos. ?Siga las indicaciones de cuidado como se lo haya indicado su m?dico. ?Comun?quese con el m?dico presenta m?s sangrado o si el problema est? empeorando en lugar de mejorar. ?Concurra a todas las visitas de seguimiento como se lo haya indicado el m?dico. Esto es importante. ?Esta informaci?n no tiene Marine scientist el consejo del m?dico. Aseg?rese de hacerle al m?dico cualquier pregunta que tenga. ?Document Revised: 03/03/2021 Document Reviewed: 03/03/2021 ?Elsevier Patient Education ? [place]. ? ?

## 2021-11-15 NOTE — Assessment & Plan Note (Signed)
Chronic constipation that started after abx course (cipro/flagyl) 06/2021. Anticipate abx related change in gut flora leading to constipation. rec start probiotic, discussed bowel regimen - increased fiber, water, Rx miralax.  ?No abd pain - not consistent with IBS or diverticulitis.  ?He is overdue for colonoscopy - will refer - but discussed anal fissure needs to heal prior to undergoing luminal evaluation.  ?

## 2022-02-04 ENCOUNTER — Other Ambulatory Visit: Payer: Self-pay | Admitting: Family Medicine

## 2022-02-04 NOTE — Telephone Encounter (Signed)
E-scribed refill.  Plz schedule CPE and lab visits for additional refills.  

## 2022-04-11 ENCOUNTER — Other Ambulatory Visit: Payer: Self-pay | Admitting: Family Medicine

## 2022-04-11 DIAGNOSIS — E291 Testicular hypofunction: Secondary | ICD-10-CM

## 2022-04-11 DIAGNOSIS — I10 Essential (primary) hypertension: Secondary | ICD-10-CM

## 2022-04-11 DIAGNOSIS — Z1159 Encounter for screening for other viral diseases: Secondary | ICD-10-CM

## 2022-04-18 ENCOUNTER — Other Ambulatory Visit (INDEPENDENT_AMBULATORY_CARE_PROVIDER_SITE_OTHER): Payer: Managed Care, Other (non HMO)

## 2022-04-18 DIAGNOSIS — Z1159 Encounter for screening for other viral diseases: Secondary | ICD-10-CM

## 2022-04-18 DIAGNOSIS — I10 Essential (primary) hypertension: Secondary | ICD-10-CM | POA: Diagnosis not present

## 2022-04-18 DIAGNOSIS — E291 Testicular hypofunction: Secondary | ICD-10-CM | POA: Diagnosis not present

## 2022-04-18 LAB — LIPID PANEL
Cholesterol: 195 mg/dL (ref 0–200)
HDL: 49.3 mg/dL (ref >39.00)
LDL Cholesterol: 128 mg/dL — ABNORMAL HIGH (ref 0–99)
NonHDL: 145.56
Total CHOL/HDL Ratio: 4
Triglycerides: 90 mg/dL (ref 0.0–149.0)
VLDL: 18 mg/dL (ref 0.0–40.0)

## 2022-04-18 LAB — BASIC METABOLIC PANEL
BUN: 11 mg/dL (ref 6–23)
CO2: 28 mEq/L (ref 19–32)
Calcium: 8.9 mg/dL (ref 8.4–10.5)
Chloride: 105 mEq/L (ref 96–112)
Creatinine, Ser: 0.75 mg/dL (ref 0.40–1.50)
GFR: 106.2 mL/min (ref >60.00)
Glucose, Bld: 101 mg/dL — ABNORMAL HIGH (ref 70–99)
Potassium: 4 mEq/L (ref 3.5–5.1)
Sodium: 142 mEq/L (ref 135–145)

## 2022-04-18 LAB — TESTOSTERONE: Testosterone: 338.54 ng/dL (ref 300.00–890.00)

## 2022-04-19 LAB — HEPATITIS C ANTIBODY: Hepatitis C Ab: NONREACTIVE

## 2022-04-29 ENCOUNTER — Ambulatory Visit (INDEPENDENT_AMBULATORY_CARE_PROVIDER_SITE_OTHER): Payer: Managed Care, Other (non HMO) | Admitting: Family Medicine

## 2022-04-29 ENCOUNTER — Encounter: Payer: Self-pay | Admitting: Family Medicine

## 2022-04-29 VITALS — BP 126/88 | HR 70 | Temp 97.9°F | Ht 67.5 in | Wt 174.8 lb

## 2022-04-29 DIAGNOSIS — I10 Essential (primary) hypertension: Secondary | ICD-10-CM | POA: Diagnosis not present

## 2022-04-29 DIAGNOSIS — Z23 Encounter for immunization: Secondary | ICD-10-CM | POA: Diagnosis not present

## 2022-04-29 DIAGNOSIS — E291 Testicular hypofunction: Secondary | ICD-10-CM

## 2022-04-29 DIAGNOSIS — S161XXA Strain of muscle, fascia and tendon at neck level, initial encounter: Secondary | ICD-10-CM | POA: Insufficient documentation

## 2022-04-29 DIAGNOSIS — Z1211 Encounter for screening for malignant neoplasm of colon: Secondary | ICD-10-CM

## 2022-04-29 DIAGNOSIS — Z0000 Encounter for general adult medical examination without abnormal findings: Secondary | ICD-10-CM | POA: Diagnosis not present

## 2022-04-29 MED ORDER — SILDENAFIL CITRATE 100 MG PO TABS: 100.0000 mg | ORAL_TABLET | ORAL | 11 refills | Status: DC | PRN

## 2022-04-29 MED ORDER — LISINOPRIL 20 MG PO TABS: 20.0000 mg | ORAL_TABLET | Freq: Every day | ORAL | 4 refills | Status: DC

## 2022-04-29 MED ORDER — CYCLOBENZAPRINE HCL 5 MG PO TABS: 5.0000 mg | ORAL_TABLET | Freq: Two times a day (BID) | ORAL | 0 refills | Status: DC | PRN

## 2022-04-29 NOTE — Assessment & Plan Note (Signed)
Chronic, stable on current regimen. Continue lisinopril.

## 2022-04-29 NOTE — Assessment & Plan Note (Signed)
Preventative protocols reviewed and updated unless pt declined. Discussed healthy diet and lifestyle.  

## 2022-04-29 NOTE — Assessment & Plan Note (Signed)
Story/exam consistent with uncomplicated neck strain. Supportive measures reviewed - gentle stretching, tylenol, heating pad, Rx flexeril with sedation precautions. Update if not improving with treatment.

## 2022-04-29 NOTE — Progress Notes (Signed)
Patient ID: Christian Weiss, male    DOB: 1971/05/30, 51 y.o.   MRN: [id number]  This visit was conducted in person.  BP 126/88   Pulse 70   Temp 97.9 F (36.6 C) (Temporal)   Ht 5' 7.5" (1.715 m)   Wt 174 lb 12.8 oz (79.3 kg)   SpO2 98%   BMI 26.97 kg/m    CC: CPE Subjective:   HPI: Christian Weiss is a 51 y.o. male presenting on 04/29/2022 for Annual Exam   2-3 wk h/o posterior neck pain. No radiation of pain. Denies inciting trauma/injury. Treating with [place] remedy (?muscle relaxant) with benefit.   Preventative: Colon cancer screening - discussed. H/o blood in stool and anal fissue, now resolved.  Prostate - no fmhx. Check PSA yearly.  Lung cancer screening - not eligible  Flu shot - yearly  COVID vaccine J&J 12/2019 Tdap 06/2020 Seat belt use discussed  Sunscreen use discussed. No changing moles on skin  Sleep - averaging 7 hours/night Non smoker Alcohol - none Dentist - due Eye exam - yearly (due)  Caffeine: 1 cup decaf coffee Lives with wife and 2 daughters (1996, 107)  Occupation: Programme researcher, broadcasting/film/video at [employer]  Activity: no regular exercise, active at work  Diet: good water, eats out a lot     Relevant past medical, surgical, family and social history reviewed and updated as indicated. Interim medical history since our last visit reviewed. Allergies and medications reviewed and updated. Outpatient Medications Prior to Visit  Medication Sig Dispense Refill   NONFORMULARY OR COMPOUNDED ITEM Nitroglycerin ointment 0.125% apply pea sized amount internally three times daily, Disp 30 GM, 1 refill 30 each 1   polyethylene glycol powder (GLYCOLAX/MIRALAX) 17 GM/SCOOP powder Take 17 g by mouth daily as needed for moderate constipation (hold for diarrhea). 3350 g 0   lisinopril (ZESTRIL) 20 MG tablet Take 1 tablet (20 mg total) by mouth daily. 90 tablet 4   sildenafil (VIAGRA) 100 MG tablet TAKE ONE TABLET BY MOUTH DAILY AS NEEDED FOR ERECTILE DYSFUNCTION 10  tablet 2   No facility-administered medications prior to visit.     Per HPI unless specifically indicated in ROS section below Review of Systems  Constitutional:  Positive for fatigue (occ). Negative for activity change, appetite change, chills, fever and unexpected weight change.  HENT:  Negative for hearing loss.   Eyes:  Negative for visual disturbance.  Respiratory:  Negative for cough, chest tightness, shortness of breath and wheezing.   Cardiovascular:  Negative for chest pain, palpitations and leg swelling.  Gastrointestinal:  Negative for abdominal distention, abdominal pain, blood in stool, constipation, diarrhea, nausea and vomiting.  Genitourinary:  Negative for difficulty urinating and hematuria.  Musculoskeletal:  Negative for arthralgias, myalgias and neck pain.  Skin:  Negative for rash.  Neurological:  Positive for headaches. Negative for dizziness, seizures and syncope.  Hematological:  Negative for adenopathy. Does not bruise/bleed easily.  Psychiatric/Behavioral:  Negative for dysphoric mood. The patient is not nervous/anxious.     Objective:  BP 126/88   Pulse 70   Temp 97.9 F (36.6 C) (Temporal)   Ht 5' 7.5" (1.715 m)   Wt 174 lb 12.8 oz (79.3 kg)   SpO2 98%   BMI 26.97 kg/m   Wt Readings from Last 3 Encounters:  04/29/22 174 lb 12.8 oz (79.3 kg)  11/15/21 176 lb 2 oz (79.9 kg)  08/24/21 176 lb 3 oz (79.9 kg)      Physical Exam  Vitals and nursing note reviewed.  Constitutional:      General: He is not in acute distress.    Appearance: Normal appearance. He is well-developed. He is not ill-appearing.  HENT:     Head: Normocephalic and atraumatic.     Right Ear: Hearing, tympanic membrane, ear canal and external ear normal.     Left Ear: Hearing, tympanic membrane, ear canal and external ear normal.  Eyes:     General: No scleral icterus.    Extraocular Movements: Extraocular movements intact.     Conjunctiva/sclera: Conjunctivae normal.      Pupils: Pupils are equal, round, and reactive to light.  Neck:     Thyroid: No thyroid mass or thyromegaly.     Comments:  FROM cervical neck, some discomfort to left neck with R lateral flexion No midline cervical or paracervical mm tenderness.  No trapezius mm tenderness Cardiovascular:     Rate and Rhythm: Normal rate and regular rhythm.     Pulses: Normal pulses.          Radial pulses are 2+ on the right side and 2+ on the left side.     Heart sounds: Normal heart sounds. No murmur heard. Pulmonary:     Effort: Pulmonary effort is normal. No respiratory distress.     Breath sounds: Normal breath sounds. No wheezing, rhonchi or rales.  Abdominal:     General: Bowel sounds are normal. There is no distension.     Palpations: Abdomen is soft. There is no mass.     Tenderness: There is no abdominal tenderness. There is no guarding or rebound.     Hernia: No hernia is present.  Musculoskeletal:        General: Normal range of motion.     Cervical back: Normal range of motion and neck supple. No rigidity.     Right lower leg: No edema.     Left lower leg: No edema.  Lymphadenopathy:     Cervical: No cervical adenopathy.  Skin:    General: Skin is warm and dry.     Findings: No rash.  Neurological:     General: No focal deficit present.     Mental Status: He is alert and oriented to person, place, and time.  Psychiatric:        Mood and Affect: Mood normal.        Behavior: Behavior normal.        Thought Content: Thought content normal.        Judgment: Judgment normal.       Results for orders placed or performed in visit on [id number]  Basic metabolic panel  Result Value Ref Range   Sodium 142 135 - 145 mEq/L   Potassium 4.0 3.5 - 5.1 mEq/L   Chloride 105 96 - 112 mEq/L   CO2 28 19 - 32 mEq/L   Glucose, Bld 101 (H) 70 - 99 mg/dL   BUN 11 6 - 23 mg/dL   Creatinine, Ser 0.75 0.40 - 1.50 mg/dL   GFR 106.20 >60.00 mL/min   Calcium 8.9 8.4 - 10.5 mg/dL  Lipid panel   Result Value Ref Range   Cholesterol 195 0 - 200 mg/dL   Triglycerides 90.0 0.0 - 149.0 mg/dL   HDL 49.30 >39.00 mg/dL   VLDL 18.0 0.0 - 40.0 mg/dL   LDL Cholesterol 128 (H) 0 - 99 mg/dL   Total CHOL/HDL Ratio 4    NonHDL 145.56   Testosterone  Result Value Ref  Range   Testosterone 338.54 300.00 - 890.00 ng/dL  Hepatitis C antibody  Result Value Ref Range   Hepatitis C Ab NON-REACTIVE NON-REACTIVE    Assessment & Plan:   Problem List Items Addressed This Visit     Health maintenance examination - Primary (Chronic)    Preventative protocols reviewed and updated unless pt declined. Discussed healthy diet and lifestyle.       Primary hypertension    Chronic, stable on current regimen. Continue lisinopril.       Relevant Medications   lisinopril (ZESTRIL) 20 MG tablet   sildenafil (VIAGRA) 100 MG tablet   Hypotestosteronemia in male    T levels normal today.       Neck strain    Story/exam consistent with uncomplicated neck strain. Supportive measures reviewed - gentle stretching, tylenol, heating pad, Rx flexeril with sedation precautions. Update if not improving with treatment.       Other Visit Diagnoses     Need for immunization against influenza       Relevant Orders   Flu Vaccine QUAD 63mo+IM (Fluarix, Fluzone & Alfiuria Quad PF) (Completed)   Special screening for malignant neoplasms, colon       Relevant Orders   Ambulatory referral to Gastroenterology        Meds ordered this encounter  Medications   lisinopril (ZESTRIL) 20 MG tablet    Sig: Take 1 tablet (20 mg total) by mouth daily.    Dispense:  90 tablet    Refill:  4   sildenafil (VIAGRA) 100 MG tablet    Sig: Take 1 tablet (100 mg total) by mouth as needed for erectile dysfunction.    Dispense:  10 tablet    Refill:  11   cyclobenzaprine (FLEXERIL) 5 MG tablet    Sig: Take 1-2 tablets (5-10 mg total) by mouth 2 (two) times daily as needed for muscle spasms.    Dispense:  30 tablet     Refill:  0   Orders Placed This Encounter  Procedures   Flu Vaccine QUAD 40mo+IM (Fluarix, Fluzone & Alfiuria Quad PF)   Ambulatory referral to Gastroenterology    Referral Priority:   Routine    Referral Type:   Consultation    Referral Reason:   Specialty Services Required    Number of Visits Requested:   1    Patient instructions: Flu shot today Lo remitiremos a gastroenterologo en [place] para colonoscopia [phone].  Para cuello - probable esquince de cuello. Use heating pad, tylenol, relajante muscular como necesite.  Gusto verlo hoy Regresar en 1 ao para proximo examen fisico.  Follow up plan: Return in about 1 year (around 04/30/2023) for annual exam, prior fasting for blood work.  Ria Bush, MD

## 2022-04-29 NOTE — Assessment & Plan Note (Addendum)
T levels normal today.

## 2022-04-29 NOTE — Patient Instructions (Addendum)
Flu shot today Lo remitiremos a Curator para colonoscopia [phone].  Para cuello - probable esquince de cuello. Use heating pad, tylenol, relajante muscular como necesite.  Gusto verlo hoy Regresar en 1 ao para proximo examen fisico.  Mantenimiento de Teacher, English as a foreign language, Male Adoptar un estilo de vida saludable y recibir atencin preventiva son importantes para promover la salud y Musician. Consulte al mdico sobre: El esquema adecuado para hacerse pruebas y exmenes peridicos. Cosas que puede hacer por su cuenta para prevenir enfermedades y [place] sano. Qu debo saber sobre la dieta, el peso y el ejercicio? Consuma una dieta saludable  Consuma una dieta que incluya muchas verduras, frutas, productos lcteos con bajo contenido de [place] y Advertising account planner. No consuma muchos alimentos ricos en grasas slidas, azcares agregados o sodio. Mantenga un peso saludable El ndice de masa muscular [facility]) es una medida que puede utilizarse para identificar posibles problemas de [place]. Proporciona una estimacin de la grasa corporal basndose en el peso y la altura. Su mdico puede ayudarle a Radiation protection practitioner [place] y a Scientist, forensic o Theatre manager un peso saludable. Haga ejercicio con regularidad Haga ejercicio con regularidad. Esta es una de las prcticas ms importantes que puede hacer por su salud. La [employer] de los adultos deben seguir estas pautas: Optometrist, al menos, 150 minutos de actividad fsica por semana. El ejercicio debe aumentar la frecuencia cardaca y Nature conservation officer transpirar (ejercicio de intensidad moderada). Hacer ejercicios de fortalecimiento por lo [employer] por semana. Agregue esto a su plan de ejercicio de intensidad moderada. Pase menos tiempo sentado. Incluso la actividad fsica ligera puede ser beneficiosa. Controle sus niveles de colesterol y lpidos en la sangre Comience a realizarse anlisis de lpidos y Research officer, trade union en la sangre a  los 33 aos y luego reptalos cada 5 aos. Es posible que Automotive engineer los niveles de colesterol con mayor frecuencia si: Sus niveles de lpidos y colesterol son altos. Es mayor de 42 aos. Presenta un alto riesgo de padecer enfermedades cardacas. Qu debo saber sobre las pruebas de deteccin del cncer? Muchos tipos de cncer pueden detectarse de manera temprana y, a menudo, pueden prevenirse. Segn su historia clnica y sus antecedentes familiares, es posible que deba realizarse pruebas de deteccin del cncer en diferentes edades. Esto puede incluir pruebas de deteccin de lo siguiente: Surveyor, minerals. Cncer de prstata. Cncer de piel. Cncer de pulmn. Qu debo saber sobre la enfermedad cardaca, la diabetes y la hipertensin arterial? Presin arterial y enfermedad cardaca La hipertensin arterial causa enfermedades cardacas y [place] el riesgo de accidente cerebrovascular. Es ms probable que esto se manifieste en las personas que tienen lecturas de presin arterial alta o tienen sobrepeso. Hable con el mdico sobre sus valores de presin arterial deseados. Hgase controlar la presin arterial: Cada 3 a 5 aos si tiene entre 18 y 3 aos. Todos los aos si es mayor de 40 aos. Si tiene entre 13 y 10 aos y es fumador o Insurance account manager, pregntele al mdico si debe realizarse una prueba de deteccin de aneurisma artico abdominal (AAA) por nica vez. Diabetes Realcese exmenes de deteccin de la diabetes con regularidad. Este anlisis revisa el nivel de azcar en la sangre en [place]. Hgase las pruebas de deteccin: Cada tres aos despus de los 30 aos de edad si tiene un peso normal y un bajo riesgo de padecer diabetes. Con ms frecuencia y a partir de [place] edad inferior si tiene sobrepeso o un alto riesgo de  padecer diabetes. Qu debo saber sobre la prevencin de infecciones? Hepatitis B Si tiene un riesgo ms alto de contraer hepatitis B, debe someterse a un examen de  deteccin de este virus. Hable con el mdico para averiguar si tiene riesgo de contraer la infeccin por hepatitis B. Hepatitis C Se recomienda un anlisis de [place] para: Todos los que nacieron entre 1945 y [phone]. Todas las personas que tengan un riesgo de haber contrado hepatitis C. Enfermedades de transmisin sexual (ETS) Debe realizarse pruebas de deteccin de ITS todos los aos, incluidas la gonorrea y la clamidia, si: Es sexualmente activo y es menor de [address]. Es mayor de [address], y Public affairs consultant informa que corre riesgo de tener este tipo de infecciones. La actividad sexual ha cambiado desde que le hicieron la ltima prueba de deteccin y tiene un riesgo mayor de Warehouse manager clamidia o Copy. Pregntele al mdico si usted tiene riesgo. Pregntele al mdico si usted tiene un alto riesgo de Primary school teacher VIH. El mdico tambin puede recomendarle un medicamento recetado para ayudar a evitar la infeccin por el VIH. Si elige tomar medicamentos para prevenir el VIH, primero debe [employer] de deteccin del VIH. Luego debe hacerse anlisis cada 3 meses mientras est tomando los medicamentos. Siga estas indicaciones en su casa: Consumo de alcohol No beba alcohol si el mdico se lo prohbe. Si bebe alcohol: Limite la cantidad que consume de 0 a 2 bebidas por da. Sepa cunta cantidad de alcohol hay en las bebidas que toma. En los [address], una medida equivale a una botella de cerveza de 12 oz (355 ml), un vaso de vino de 5 oz (148 ml) o un vaso de una bebida alcohlica de alta graduacin de 1 oz (44 ml). Estilo de vida No consuma ningn producto que contenga nicotina o tabaco. Estos productos incluyen cigarrillos, tabaco para Theatre manager y aparatos de vapeo, como los Administrator, Civil Service. Si necesita ayuda para dejar de consumir estos productos, consulte al mdico. No consuma drogas. No comparta agujas. Solicite ayuda a su mdico si necesita apoyo o informacin para abandonar las  drogas. Indicaciones generales Realcese los estudios de rutina de [address], dentales y de Wellsite geologist. Mantngase al da con las vacunas. Infrmele a su mdico si: Se siente deprimido con frecuencia. Alguna vez ha sido vctima de [place] o no se siente seguro en su casa. Resumen Adoptar un estilo de vida saludable y recibir atencin preventiva son importantes para promover la salud y Counsellor. Siga las instrucciones del mdico acerca de una dieta saludable, el ejercicio y la realizacin de pruebas o exmenes para Hotel manager. Siga las instrucciones del mdico con respecto al control del colesterol y la presin arterial. Esta informacin no tiene Theme park manager el consejo del mdico. Asegrese de hacerle al mdico cualquier pregunta que tenga. Document Revised: 12/16/2020 Document Reviewed: 12/16/2020 Elsevier Patient Education  2023 [employer].

## 2022-05-03 ENCOUNTER — Telehealth: Payer: Self-pay

## 2022-05-03 NOTE — Telephone Encounter (Signed)
LVM for pt to return my call to schedule colonoscopy.  Referral in workque.  Thanks,  [place], [place]

## 2022-05-03 NOTE — Telephone Encounter (Signed)
[facility] reening coolonoscopy is fine no office visit

## 2022-05-03 NOTE — Telephone Encounter (Signed)
  We have received referral for patient to have his screening colonoscopy.  Upon my chart review-I noticed Dr. 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  Please advise as to if we need an office visit or move forward with colonoscopy.  Thank you,  Sharyn Lull, CMA

## 2022-08-05 ENCOUNTER — Ambulatory Visit (INDEPENDENT_AMBULATORY_CARE_PROVIDER_SITE_OTHER): Payer: Managed Care, Other (non HMO) | Admitting: Family Medicine

## 2022-08-05 ENCOUNTER — Encounter: Payer: Self-pay | Admitting: Family Medicine

## 2022-08-05 VITALS — BP 124/84 | HR 66 | Temp 97.2°F | Ht 67.5 in | Wt 172.5 lb

## 2022-08-05 DIAGNOSIS — S2341XA Sprain of ribs, initial encounter: Secondary | ICD-10-CM | POA: Diagnosis not present

## 2022-08-05 MED ORDER — CYCLOBENZAPRINE HCL 5 MG PO TABS: 5.0000 mg | ORAL_TABLET | Freq: Two times a day (BID) | ORAL | 1 refills | Status: DC | PRN

## 2022-08-05 NOTE — Patient Instructions (Addendum)
Creo que tuve esquince de musculo intercostal entre las costillas [place] flexeril, almohada caliente para area de dolor, tylenol/advil para dolor.  Ejercicios levels de estiramiento.  Si no mejora con esto, llamenos para rayos x (plaqua toracica).

## 2022-08-05 NOTE — Assessment & Plan Note (Signed)
R lateral ribcage sprain after influenza with persistent cough. Symptoms are overall improving over last 24 hours. Supportive measures reviewed, as well as anticipated course of recovery. Update if not continued improvement day to check CXR. Pt agrees with plan.  Rx flexeril muscle relaxant

## 2022-08-05 NOTE — Progress Notes (Signed)
Patient ID: Christian Weiss, male    DOB: 24-Jun-1971, 52 y.o.   MRN: [id number]  This visit was conducted in person.  BP 124/84   Pulse 66   Temp (!) 97.2 F (36.2 C) (Temporal)   Ht 5' 7.5" (1.715 m)   Wt 172 lb 8 oz (78.2 kg)   SpO2 98%   BMI 26.62 kg/m    CC: back pain  Subjective:   HPI: Christian Weiss is a 52 y.o. male presenting on 08/05/2022 for Back Pain (C/o upper R-side back pain radiating around to chest and cough. Pain occurs with cough and breathing deeply. Sxs started 07/19/22- exposure to flu from family member. )   Acute respiratory infection 07/16/2022 - was exposed to daughter with influenza. Was in bed for 3 days. Since then having significant cough and develoed; R sided lateral chest pain worse with cough, with radiation to anterior right chest wall. Ongoing pain especially with cough, turning over to right in bed, even to the touch. Painful with movements he does for work - lifting. Also with mid abdominal pain/cramping with cough. Ongoing productive cough, not colored.   No fevers/chills, dyspnea, ST, congestion.   No blistering skin rash.   Treating symptoms salon pas patches, vicks vaporub,a tylenol and ibuprofen.      Relevant past medical, surgical, family and social history reviewed and updated as indicated. Interim medical history since our last visit reviewed. Allergies and medications reviewed and updated. Outpatient Medications Prior to Visit  Medication Sig Dispense Refill   lisinopril (ZESTRIL) 20 MG tablet Take 1 tablet (20 mg total) by mouth daily. 90 tablet 4   sildenafil (VIAGRA) 100 MG tablet Take 1 tablet (100 mg total) by mouth as needed for erectile dysfunction. 10 tablet 11   cyclobenzaprine (FLEXERIL) 5 MG tablet Take 1-2 tablets (5-10 mg total) by mouth 2 (two) times daily as needed for muscle spasms. 30 tablet 0   NONFORMULARY OR COMPOUNDED ITEM Nitroglycerin ointment 0.125% apply pea sized amount internally three times daily,  Disp 30 GM, 1 refill 30 each 1   polyethylene glycol powder (GLYCOLAX/MIRALAX) 17 GM/SCOOP powder Take 17 g by mouth daily as needed for moderate constipation (hold for diarrhea). 3350 g 0   No facility-administered medications prior to visit.     Per HPI unless specifically indicated in ROS section below Review of Systems  Objective:  BP 124/84   Pulse 66   Temp (!) 97.2 F (36.2 C) (Temporal)   Ht 5' 7.5" (1.715 m)   Wt 172 lb 8 oz (78.2 kg)   SpO2 98%   BMI 26.62 kg/m   Wt Readings from Last 3 Encounters:  08/05/22 172 lb 8 oz (78.2 kg)  04/29/22 174 lb 12.8 oz (79.3 kg)  11/15/21 176 lb 2 oz (79.9 kg)      Physical Exam Vitals and nursing note reviewed.  Constitutional:      Appearance: Normal appearance. He is not ill-appearing.  Eyes:     Extraocular Movements: Extraocular movements intact.     Conjunctiva/sclera: Conjunctivae normal.     Pupils: Pupils are equal, round, and reactive to light.  Cardiovascular:     Rate and Rhythm: Normal rate and regular rhythm.     Pulses: Normal pulses.     Heart sounds: Normal heart sounds. No murmur heard. Pulmonary:     Effort: Pulmonary effort is normal. No respiratory distress.     Breath sounds: Normal breath sounds. No wheezing, rhonchi or  rales.    Chest:     Chest wall: Tenderness present.       Comments: Reproducible chest wall tenderness to lateral ribcage at axillary line  Musculoskeletal:     Right lower leg: No edema.     Left lower leg: No edema.  Skin:    General: Skin is warm and dry.     Findings: No rash.  Neurological:     Mental Status: He is alert.  Psychiatric:        Mood and Affect: Mood normal.        Behavior: Behavior normal.        Assessment & Plan:   Problem List Items Addressed This Visit     Sprain of ribs - Primary    R lateral ribcage sprain after influenza with persistent cough. Symptoms are overall improving over last 24 hours. Supportive measures reviewed, as well as  anticipated course of recovery. Update if not continued improvement day to check CXR. Pt agrees with plan.  Rx flexeril muscle relaxant         Meds ordered this encounter  Medications   cyclobenzaprine (FLEXERIL) 5 MG tablet    Sig: Take 1-2 tablets (5-10 mg total) by mouth 2 (two) times daily as needed for muscle spasms.    Dispense:  30 tablet    Refill:  1    No orders of the defined types were placed in this encounter.   Patient Instructions  Creo que tuve esquince de musculo intercostal [place] flexeril, almohada caliente para area de dolor, tylenol/advil para dolor.  Ejercicios levels de estiramiento.  Si no mejora con esto, llamenos para rayos x (plaqua toracica).   Follow up plan: No follow-ups on file.  Ria Bush, MD

## 2023-07-03 ENCOUNTER — Other Ambulatory Visit: Payer: Self-pay | Admitting: Family Medicine

## 2023-07-04 NOTE — Telephone Encounter (Signed)
 Spoke to pt, scheduled cpe for 09/26/23

## 2023-07-04 NOTE — Telephone Encounter (Signed)
Pt has been given a 3 month supply on meds. Needs CPE with Dr Sharen Hones. Thanks

## 2023-09-26 ENCOUNTER — Encounter: Payer: Self-pay | Admitting: Family Medicine

## 2023-09-26 ENCOUNTER — Ambulatory Visit (INDEPENDENT_AMBULATORY_CARE_PROVIDER_SITE_OTHER): Payer: Managed Care, Other (non HMO) | Admitting: Family Medicine

## 2023-09-26 VITALS — BP 134/86 | HR 71 | Temp 97.9°F | Ht 67.5 in | Wt 184.1 lb

## 2023-09-26 DIAGNOSIS — Z0000 Encounter for general adult medical examination without abnormal findings: Secondary | ICD-10-CM | POA: Diagnosis not present

## 2023-09-26 DIAGNOSIS — I10 Essential (primary) hypertension: Secondary | ICD-10-CM

## 2023-09-26 DIAGNOSIS — Z23 Encounter for immunization: Secondary | ICD-10-CM

## 2023-09-26 DIAGNOSIS — Z125 Encounter for screening for malignant neoplasm of prostate: Secondary | ICD-10-CM | POA: Diagnosis not present

## 2023-09-26 DIAGNOSIS — Z1211 Encounter for screening for malignant neoplasm of colon: Secondary | ICD-10-CM | POA: Diagnosis not present

## 2023-09-26 DIAGNOSIS — M25512 Pain in left shoulder: Secondary | ICD-10-CM | POA: Insufficient documentation

## 2023-09-26 DIAGNOSIS — G8929 Other chronic pain: Secondary | ICD-10-CM

## 2023-09-26 DIAGNOSIS — N529 Male erectile dysfunction, unspecified: Secondary | ICD-10-CM

## 2023-09-26 LAB — COMPREHENSIVE METABOLIC PANEL
ALT: 27 U/L (ref 0–53)
AST: 22 U/L (ref 0–37)
Albumin: 4.3 g/dL (ref 3.5–5.2)
Alkaline Phosphatase: 81 U/L (ref 39–117)
BUN: 14 mg/dL (ref 6–23)
CO2: 30 meq/L (ref 19–32)
Calcium: 8.9 mg/dL (ref 8.4–10.5)
Chloride: 104 meq/L (ref 96–112)
Creatinine, Ser: 0.64 mg/dL (ref 0.40–1.50)
GFR: 110.29 mL/min (ref >60.00)
Glucose, Bld: 96 mg/dL (ref 70–99)
Potassium: 4.1 meq/L (ref 3.5–5.1)
Sodium: 140 meq/L (ref 135–145)
Total Bilirubin: 0.5 mg/dL (ref 0.2–1.2)
Total Protein: 7 g/dL (ref 6.0–8.3)

## 2023-09-26 LAB — LIPID PANEL
Cholesterol: 231 mg/dL — ABNORMAL HIGH (ref 0–200)
HDL: 53.1 mg/dL (ref >39.00)
LDL Cholesterol: 155 mg/dL — ABNORMAL HIGH (ref 0–99)
NonHDL: 178.24
Total CHOL/HDL Ratio: 4
Triglycerides: 118 mg/dL (ref 0.0–149.0)
VLDL: 23.6 mg/dL (ref 0.0–40.0)

## 2023-09-26 LAB — PSA: PSA: 1.13 ng/mL (ref 0.10–4.00)

## 2023-09-26 MED ORDER — SILDENAFIL CITRATE 100 MG PO TABS
50.0000 mg | ORAL_TABLET | ORAL | 6 refills | Status: AC | PRN
Start: 2023-09-26 — End: ?

## 2023-09-26 MED ORDER — CYCLOBENZAPRINE HCL 5 MG PO TABS
5.0000 mg | ORAL_TABLET | Freq: Two times a day (BID) | ORAL | 1 refills | Status: AC | PRN
Start: 2023-09-26 — End: ?

## 2023-09-26 MED ORDER — LISINOPRIL 20 MG PO TABS: 20.0000 mg | ORAL_TABLET | Freq: Every day | ORAL | 4 refills | Status: AC

## 2023-09-26 NOTE — Assessment & Plan Note (Signed)
 Ongoing for 3 months Anticipate biceps tendonitis  Supportive measures reviewed - ice, NSAID, provided with resistance band and exercises from SM pt advisor.  Update if not improving with treatment, consider sports med eval

## 2023-09-26 NOTE — Assessment & Plan Note (Signed)
 Stable with PRN viagra - continue

## 2023-09-26 NOTE — Assessment & Plan Note (Signed)
Chronic, stable on lisinopril - continue.  

## 2023-09-26 NOTE — Progress Notes (Signed)
 Ph: [phone] Fax: [phone]   Patient ID: Christian Weiss, male    DOB: Jan 02, 1971, 53 y.o.   MRN: [id number]  This visit was conducted in person.  BP 134/86   Pulse 71   Temp 97.9 F (36.6 C) (Oral)   Ht 5' 7.5" (1.715 m)   Wt 184 lb 2 oz (83.5 kg)   SpO2 95%   BMI 28.41 kg/m    CC: CPE Subjective:   HPI: Christian Weiss is a 53 y.o. male presenting on 09/26/2023 for Annual Exam   L handed.  Notes some L neck/shoulder pain worse when sleeping, ongoing over 3 months. Treating with tylenol.   Preventative: Colon cancer screening - discussed. H/o blood in stool and anal fissue, now resolved.  Prostate - no fmhx. Check PSA yearly.  Lung cancer screening - not eligible  Flu shot - yearly  COVID vaccine J&J 12/2019 Tdap 06/2020 Shingrix - started today 09/2023 Seat belt use discussed  Sunscreen use discussed. No changing moles on skin.  Sleep - averaging 7-8 hours/night Non smoker  Alcohol - none Dentist - yearly Eye exam - q2 yrs  Caffeine: 1 cup decaf coffee Lives with wife and 2 daughters (1996, 15)  Occupation: Retail banker at [employer]  Activity: no regular exercise - recommend start regular exercise routine Diet: good water, eats out a lot      Relevant past medical, surgical, family and social history reviewed and updated as indicated. Interim medical history since our last visit reviewed. Allergies and medications reviewed and updated. Outpatient Medications Prior to Visit  Medication Sig Dispense Refill   cyclobenzaprine (FLEXERIL) 5 MG tablet Take 1-2 tablets (5-10 mg total) by mouth 2 (two) times daily as needed for muscle spasms. 30 tablet 1   lisinopril (ZESTRIL) 20 MG tablet TAKE 1 TABLET BY MOUTH DAILY 90 tablet 0   sildenafil (VIAGRA) 100 MG tablet TAKE 1 TABLET BY MOUTH DAILY AS NEEDED FOR ERECTILE DYSFUNCTION 10 tablet 2   No facility-administered medications prior to visit.     Per HPI unless specifically indicated in ROS section  below Review of Systems  Constitutional:  Negative for activity change, appetite change, chills, fatigue, fever and unexpected weight change.  HENT:  Negative for hearing loss.   Eyes:  Negative for visual disturbance.  Respiratory:  Negative for cough, chest tightness, shortness of breath and wheezing.   Cardiovascular:  Negative for chest pain, palpitations and leg swelling.  Gastrointestinal:  Negative for abdominal distention, abdominal pain, blood in stool, constipation, diarrhea, nausea and vomiting.  Genitourinary:  Negative for difficulty urinating and hematuria.  Musculoskeletal:  Negative for arthralgias, myalgias and neck pain.  Skin:  Negative for rash.  Neurological:  Positive for headaches (to neck and posterior head). Negative for dizziness, seizures and syncope.  Hematological:  Negative for adenopathy. Does not bruise/bleed easily.  Psychiatric/Behavioral:  Negative for dysphoric mood. The patient is not nervous/anxious.     Objective:  BP 134/86   Pulse 71   Temp 97.9 F (36.6 C) (Oral)   Ht 5' 7.5" (1.715 m)   Wt 184 lb 2 oz (83.5 kg)   SpO2 95%   BMI 28.41 kg/m   Wt Readings from Last 3 Encounters:  09/26/23 184 lb 2 oz (83.5 kg)  08/05/22 172 lb 8 oz (78.2 kg)  04/29/22 174 lb 12.8 oz (79.3 kg)      Physical Exam Vitals and nursing note reviewed.  Constitutional:      General:  He is not in acute distress.    Appearance: Normal appearance. He is well-developed. He is not ill-appearing.  HENT:     Head: Normocephalic and atraumatic.     Right Ear: Hearing, tympanic membrane, ear canal and external ear normal.     Left Ear: Hearing, tympanic membrane, ear canal and external ear normal.     Mouth/Throat:     Mouth: Mucous membranes are moist.     Pharynx: Oropharynx is clear. No oropharyngeal exudate or posterior oropharyngeal erythema.  Eyes:     General: No scleral icterus.    Extraocular Movements: Extraocular movements intact.      Conjunctiva/sclera: Conjunctivae normal.     Pupils: Pupils are equal, round, and reactive to light.  Neck:     Thyroid: No thyroid mass or thyromegaly.  Cardiovascular:     Rate and Rhythm: Normal rate and regular rhythm.     Pulses: Normal pulses.          Radial pulses are 2+ on the right side and 2+ on the left side.     Heart sounds: Normal heart sounds. No murmur heard. Pulmonary:     Effort: Pulmonary effort is normal. No respiratory distress.     Breath sounds: Normal breath sounds. No wheezing, rhonchi or rales.  Abdominal:     General: Bowel sounds are normal. There is no distension.     Palpations: Abdomen is soft. There is no mass.     Tenderness: There is no abdominal tenderness. There is no guarding or rebound.     Hernia: No hernia is present.  Musculoskeletal:        General: Normal range of motion.     Cervical back: Normal range of motion and neck supple.     Right lower leg: No edema.     Left lower leg: No edema.     Comments:  R shoulder WNL L shoulder exam: No deformity of shoulders on inspection. No pain with palpation of shoulder landmarks. FROM in abduction and forward flexion. No pain or weakness with testing SITS in ext/int rotation. No pain with empty can sign. Discomfort with Speed test. No impingement. No pain with crossover test. No pain with rotation of humeral head in GH joint.   Lymphadenopathy:     Cervical: No cervical adenopathy.  Skin:    General: Skin is warm and dry.     Findings: No rash.  Neurological:     General: No focal deficit present.     Mental Status: He is alert and oriented to person, place, and time.  Psychiatric:        Mood and Affect: Mood normal.        Behavior: Behavior normal.        Thought Content: Thought content normal.        Judgment: Judgment normal.        Assessment & Plan:   Problem List Items Addressed This Visit     Health maintenance examination - Primary (Chronic)   Preventative protocols  reviewed and updated unless pt declined. Discussed healthy diet and lifestyle.       Primary hypertension   Chronic, stable on lisinopril - continue.       Relevant Medications   lisinopril (ZESTRIL) 20 MG tablet   sildenafil (VIAGRA) 100 MG tablet   Other Relevant Orders   Comprehensive metabolic panel   Lipid panel   Erectile dysfunction   Stable with PRN viagra - continue  Left shoulder pain   Ongoing for 3 months Anticipate biceps tendonitis  Supportive measures reviewed - ice, NSAID, provided with resistance band and exercises from SM pt advisor.  Update if not improving with treatment, consider sports med eval       Other Visit Diagnoses       Special screening for malignant neoplasms, colon       Relevant Orders   Cologuard     Special screening for malignant neoplasm of prostate       Relevant Orders   PSA     Need for influenza vaccination       Relevant Orders   Flu vaccine trivalent PF, 6mos and older(Flulaval,Afluria,Fluarix,Fluzone) (Completed)     Need for shingles vaccine       Relevant Orders   Varicella-zoster vaccine IM (Completed)        Meds ordered this encounter  Medications   cyclobenzaprine (FLEXERIL) 5 MG tablet    Sig: Take 1-2 tablets (5-10 mg total) by mouth 2 (two) times daily as needed for muscle spasms.    Dispense:  30 tablet    Refill:  1   lisinopril (ZESTRIL) 20 MG tablet    Sig: Take 1 tablet (20 mg total) by mouth daily.    Dispense:  90 tablet    Refill:  4   sildenafil (VIAGRA) 100 MG tablet    Sig: Take 0.5-1 tablets (50-100 mg total) by mouth as needed for erectile dysfunction.    Dispense:  10 tablet    Refill:  6    Orders Placed This Encounter  Procedures   Varicella-zoster vaccine IM   Flu vaccine trivalent PF, 6mos and older(Flulaval,Afluria,Fluarix,Fluzone)   Cologuard   Comprehensive metabolic panel   Lipid panel   PSA    Patient Instructions  Flu shot and 1st shingles shot today Return in 2-6  months for nurse visit for 2nd. Le he inscribido para Cologuard - debe llegar caja por correo.  Para dolor de hombro - puede tomar advil 400-600mg  2-3 veces al dia con comida por los proximos 3-5 dias, luego como lo necesite. Usar hielo cubierto en toalla al hombro. Haga ejercicios que le he dado hoy. Dejenos saber si no mejora con esto.  Gusto verlo hoy. Regresar en 1 ao para proximo examen fisico.  Follow up plan: Return in about 1 year (around 09/25/2024).  Eustaquio Boyden, MD

## 2023-09-26 NOTE — Assessment & Plan Note (Signed)
 Preventative protocols reviewed and updated unless pt declined. Discussed healthy diet and lifestyle.

## 2023-09-26 NOTE — Patient Instructions (Addendum)
 Flu shot and 1st shingles shot today Return in 2-6 months for nurse visit for 2nd. Le he inscribido para Cologuard - debe llegar caja por correo.  Para dolor de hombro - puede tomar advil 400-600mg  2-3 veces al dia con comida por los proximos 3-5 dias, luego como lo necesite. Usar hielo cubierto en toalla al hombro. Haga ejercicios que le he dado hoy. Dejenos saber si no mejora con esto.  Gusto verlo hoy. Regresar en 1 ao para proximo examen fisico.

## 2023-10-03 ENCOUNTER — Encounter: Payer: Self-pay | Admitting: Family Medicine

## 2023-10-03 DIAGNOSIS — E785 Hyperlipidemia, unspecified: Secondary | ICD-10-CM | POA: Insufficient documentation

## 2023-10-10 LAB — COLOGUARD: Cologuard: NEGATIVE

## 2023-10-14 LAB — COLOGUARD: COLOGUARD: NEGATIVE

## 2023-10-17 ENCOUNTER — Encounter: Payer: Self-pay | Admitting: Family Medicine
